# Patient Record
Sex: Female | Born: 1985 | Race: White | Hispanic: No | Marital: Single | Smoking: Never smoker
Health system: Southern US, Community
[De-identification: ages and names within clinical notes are randomized; demographics above are authoritative.]

---

## 2016-06-18 DIAGNOSIS — Z3041 Encounter for surveillance of contraceptive pills: Secondary | ICD-10-CM | POA: Insufficient documentation

## 2019-03-18 ENCOUNTER — Encounter (HOSPITAL_COMMUNITY): Payer: Self-pay | Admitting: Emergency Medicine

## 2019-03-18 ENCOUNTER — Emergency Department (HOSPITAL_COMMUNITY): Payer: Worker's Compensation

## 2019-03-18 ENCOUNTER — Other Ambulatory Visit: Payer: Self-pay

## 2019-03-18 ENCOUNTER — Emergency Department (HOSPITAL_COMMUNITY)
Admission: EM | Admit: 2019-03-18 | Discharge: 2019-03-18 | Disposition: A | Discharge status: Home / Self Care | Payer: Worker's Compensation | Attending: Emergency Medicine | Admitting: Emergency Medicine

## 2019-03-18 DIAGNOSIS — M25512 Pain in left shoulder: Secondary | ICD-10-CM | POA: Diagnosis not present

## 2019-03-18 DIAGNOSIS — S0990XA Unspecified injury of head, initial encounter: Secondary | ICD-10-CM | POA: Insufficient documentation

## 2019-03-18 DIAGNOSIS — Y999 Unspecified external cause status: Secondary | ICD-10-CM | POA: Insufficient documentation

## 2019-03-18 DIAGNOSIS — Y9301 Activity, walking, marching and hiking: Secondary | ICD-10-CM | POA: Diagnosis not present

## 2019-03-18 DIAGNOSIS — Y92219 Unspecified school as the place of occurrence of the external cause: Secondary | ICD-10-CM | POA: Insufficient documentation

## 2019-03-18 DIAGNOSIS — W01198A Fall on same level from slipping, tripping and stumbling with subsequent striking against other object, initial encounter: Secondary | ICD-10-CM | POA: Diagnosis not present

## 2019-03-18 MED ORDER — IBUPROFEN 400 MG PO TABS
600.0000 mg | ORAL_TABLET | Freq: Once | ORAL | Status: AC
  Administered 2019-03-18: 600 mg via ORAL
  Filled 2019-03-18: qty 1

## 2019-03-18 NOTE — ED Notes (Addendum)
Here by EMS, ambulatory from stretcher to wheelchair. Gait steady. Does have 20g to [place].

## 2019-03-18 NOTE — ED Notes (Signed)
Patient verbalizes understanding of discharge instructions. Opportunity for questioning and answers were provided. Armband removed by staff, pt discharged from ED.  

## 2019-03-18 NOTE — Discharge Instructions (Signed)
Please read instructions below. You can treat your headache and shoulder pain with over-the-counter medications such as tylenol as needed. Apply ice for 20 minutes at a time. Stay hydrated and get plenty of rest. Limit your screen time and complex thinking. Avoid any contact sports/activities to prevent re-injury to your head. Follow up with your primary care provider in 1 week for re-check and to be cleared to return to normal activity. Return to the ER if you develop severely worsening headache, changes in your vision, persistent vomiting, or new or concerning symptoms.

## 2019-03-18 NOTE — ED Provider Notes (Signed)
[facility] EMERGENCY DEPARTMENT Provider Note   CSN: [id number] Arrival date & time: 03/18/19  1328     History   Chief Complaint Chief Complaint  Patient presents with  . Fall    HPI Alexis Bautista is a 33 y.o. female presenting the emergency department with complaint of left shoulder pain after mechanical fall that occurred prior to arrival.  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  She states she was trying to catch the door for a colleague when she excellently tripped over the rolled up rug and hit her right frontal scalp on the edge of the door and fell down onto her left shoulder.  She has pain to the left shoulder radiating to left clavicle that is worse with movement.  She also has some localized headache where she hit her head.  No LOC.  No associated vomiting, neck pain, back pain, vision changes, or any other complaints today.  Not on anticoagulation.     The history is provided by the patient.    History reviewed. No pertinent past medical history.  There are no active problems to display for this patient.   History reviewed. No pertinent surgical history.   OB History   No obstetric history on file.      Home Medications    Prior to Admission medications   Not on File    Family History No family history on file.  Social History Social History   Tobacco Use  . Smoking status: Not on file  Substance Use Topics  . Alcohol use: Not on file  . Drug use: Not on file     Allergies   Patient has no allergy information on record.   Review of Systems Review of Systems  All other systems reviewed and are negative.    Physical Exam Updated Vital Signs BP 137/81 (BP Location: Right Arm)   Pulse 66   Temp 98.3 F (36.8 C) (Oral)   Resp 18   LMP 03/17/2019   SpO2 100%   Physical Exam Vitals signs and nursing note reviewed.  Constitutional:      Appearance: She is  well-developed.  HENT:     Head: Normocephalic.     Comments: Very superficial abrasions to the right frontal scalp.  No large hematoma.  No deformity. Eyes:     Conjunctiva/sclera: Conjunctivae normal.  Neck:     Musculoskeletal: Normal range of motion and neck supple.     Comments: No midline C-spine or paraspinal tenderness, no any subsequent deformities.  Neck with full normal range of motion without pain. Cardiovascular:     Rate and Rhythm: Normal rate and regular rhythm.  Pulmonary:     Effort: Pulmonary effort is normal. No respiratory distress.     Breath sounds: Normal breath sounds.  Musculoskeletal:     Comments: Generalized tenderness to left shoulder joint and left clavicle.  No deformity or swelling.  Range of motion is normal though does cause pain.  Neurological:     Mental Status: She is alert.     Comments: Mental Status:  Alert, oriented, thought content appropriate, able to give a coherent history. Speech fluent without evidence of aphasia. Able to follow 2 step commands without difficulty.  Cranial Nerves:  II:  Peripheral visual fields grossly normal, pupils equal, round, reactive to light III,IV, VI: ptosis not present, extra-ocular motions intact bilaterally  V,VII: smile symmetric, facial light  touch sensation equal VIII: hearing grossly normal to voice  X: uvula elevates symmetrically  XI: bilateral shoulder shrug symmetric and strong XII: midline tongue extension without fassiculations Motor:  Normal tone. 5/5 in upper and lower extremities bilaterally including strong and equal grip strength and dorsiflexion/plantar flexion Sensory: grossly normal in all extremities.  Gait: normal gait and balance CV: distal pulses palpable throughout    Psychiatric:        Mood and Affect: Mood normal.        Behavior: Behavior normal.      ED Treatments / Results  Labs (all labs ordered are listed, but only abnormal results are displayed) Labs Reviewed - No  data to display  EKG None  Radiology Dg Clavicle Left  Result Date: 03/18/2019 CLINICAL DATA:  Pain at the left shoulder and clavicle after fall EXAM: LEFT CLAVICLE - 2+ VIEWS; LEFT SHOULDER - 2+ VIEW COMPARISON:  None. FINDINGS: Left shoulder is intact without fracture or dislocation. Glenohumeral and acromioclavicular joints are normal. Dedicated views of the left clavicle show no acute fracture or malalignment. No focal soft tissue abnormality. IMPRESSION: No acute osseous abnormality of the left shoulder or clavicle. Electronically Signed   By: Davina Poke M.D.   On: 03/18/2019 16:47   Dg Shoulder Left  Result Date: 03/18/2019 CLINICAL DATA:  Pain at the left shoulder and clavicle after fall EXAM: LEFT CLAVICLE - 2+ VIEWS; LEFT SHOULDER - 2+ VIEW COMPARISON:  None. FINDINGS: Left shoulder is intact without fracture or dislocation. Glenohumeral and acromioclavicular joints are normal. Dedicated views of the left clavicle show no acute fracture or malalignment. No focal soft tissue abnormality. IMPRESSION: No acute osseous abnormality of the left shoulder or clavicle. Electronically Signed   By: Davina Poke M.D.   On: 03/18/2019 16:47    Procedures Procedures (including critical care time)  Medications Ordered in ED Medications  ibuprofen (ADVIL) tablet 600 mg (600 mg Oral Given 03/18/19 1656)     Initial Impression / Assessment and Plan / ED Course  I have reviewed the triage vital signs and the nursing notes.  Pertinent labs & imaging results that were available during my care of the patient were reviewed by me and considered in my medical decision making (see chart for details).        Patient presenting with left shoulder pain and right scalp contusion after mechanical fall that occurred prior to arrival.  No LOC, now on anticoagulation.  Patient has mild headache though no red flags concerning for his head injury.  Patient has normal neurologic exam and is  well-appearing.  Do not believe the CT imaging of the head is indicated at this time.  Imaging of the left shoulder and clavicle are negative for acute fracture dislocation.  Pain treated in the ED with ibuprofen and ice with some improvement in symptoms.  Recommend symptomatic management and concussion precautions.  Patient is well-appearing, agreeable plan, and safe for discharge.  Discussed results, findings, treatment and follow up. Patient advised of return precautions. Patient verbalized understanding and agreed with plan.   Final Clinical Impressions(s) / ED Diagnoses   Final diagnoses:  Minor head injury, initial encounter  Acute pain of left shoulder    ED Discharge Orders    None       Rayfield Beem, [place] N, PA-C 03/18/19 1728    Quintella Reichert, MD 03/19/19 [phone]

## 2019-03-18 NOTE — ED Triage Notes (Signed)
Pt to ER for evaluation after a fall. She tripped and fell, hitting her head. Reports dizziness after. A/o x4. No anticoagulation.

## 2019-03-18 NOTE — ED Notes (Signed)
To x-ray

## 2019-03-24 ENCOUNTER — Other Ambulatory Visit: Payer: Self-pay

## 2019-03-25 ENCOUNTER — Ambulatory Visit (INDEPENDENT_AMBULATORY_CARE_PROVIDER_SITE_OTHER): Payer: Worker's Compensation | Admitting: Family Medicine

## 2019-03-25 ENCOUNTER — Encounter: Payer: Self-pay | Admitting: Family Medicine

## 2019-03-25 VITALS — BP 118/90 | HR 106 | Temp 97.9°F | Ht 73.0 in | Wt 256.8 lb

## 2019-03-25 DIAGNOSIS — Z7689 Persons encountering health services in other specified circumstances: Secondary | ICD-10-CM | POA: Diagnosis not present

## 2019-03-25 DIAGNOSIS — M549 Dorsalgia, unspecified: Secondary | ICD-10-CM | POA: Diagnosis not present

## 2019-03-25 DIAGNOSIS — W19XXXD Unspecified fall, subsequent encounter: Secondary | ICD-10-CM

## 2019-03-25 DIAGNOSIS — M25512 Pain in left shoulder: Secondary | ICD-10-CM | POA: Diagnosis not present

## 2019-03-25 MED ORDER — CYCLOBENZAPRINE HCL 5 MG PO TABS: 5.0000 mg | ORAL_TABLET | Freq: Every day | ORAL | 0 refills | Status: DC

## 2019-03-25 MED ORDER — METHYLPREDNISOLONE 4 MG PO TBPK: ORAL_TABLET | ORAL | 0 refills | Status: DC

## 2019-03-25 MED ORDER — NAPROXEN 500 MG PO TABS: 500.0000 mg | ORAL_TABLET | Freq: Two times a day (BID) | ORAL | 0 refills | Status: DC

## 2019-03-25 NOTE — Progress Notes (Signed)
Alexis Bautista is a 33 y.o. female  Chief Complaint  Patient presents with  . Follow-up    workers comp injury follow up    HPI: Alexis Bautista is a 33 y.o. female here to establish care with our office. 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 She tripped and fell over an outdoor rug at school and hit Rt front of her head on a door frame and landed on her Lt side. This occurred 1 week ago. She went to ER, xrays of Lt shoulder and clavicle were negative.  She complains of intermittent "soreness" that is mostly with motion. Limited ROM in Lt shoulder.  Lower back pain x 3 days. Pain worse with movement. No headache, dizziness, no vision changes. Pts sleep is at baseline. No new numbness or tingling. No weakness. Pt has been taking ibuprofen 800mg  every 6-8hrs, using heat and ice. She has also been doing "basic yoga stretches".   History reviewed. No pertinent past medical history.  History reviewed. No pertinent surgical history.  Social History   Socioeconomic History  . Marital status: Single    Spouse name: Not on file  . Number of children: Not on file  . Years of education: Not on file  . Highest education level: Not on file  Occupational History  . Not on file  Social Needs  . Financial resource strain: Not on file  . Food insecurity    Worry: Not on file    Inability: Not on file  . Transportation needs    Medical: Not on file    Non-medical: Not on file  Tobacco Use  . Smoking status: Never Smoker  . Smokeless tobacco: Never Used  Substance and Sexual Activity  . Alcohol use: Yes  . Drug use: Never  . Sexual activity: Not on file  Lifestyle  . Physical activity    Days per week: Not on file    Minutes per session: Not on file  . Stress: Not on file  Relationships  . Social Herbalist on phone: Not on file    Gets together: Not on file    Attends religious service: Not on file    Active member of club or organization: Not on file   Attends meetings of clubs or organizations: Not on file    Relationship status: Not on file  . Intimate partner violence    Fear of current or ex partner: Not on file    Emotionally abused: Not on file    Physically abused: Not on file    Forced sexual activity: Not on file  Other Topics Concern  . Not on file  Social History Narrative  . Not on file    Family History  Problem Relation Age of Onset  . Cancer Mother   . Hypertension Father       There is no immunization history on file for this patient.  Outpatient Encounter Medications as of 03/25/2019  Medication Sig  . BLISOVI 24 FE 1-20 MG-MCG(24) tablet Take 1 tablet by mouth daily.  Marland Kitchen lithium carbonate (ESKALITH) 450 MG CR tablet Take 450 mg by mouth 2 (two) times daily.  . sertraline (ZOLOFT) 50 MG tablet Take by mouth.  . traZODone (DESYREL) 50 MG tablet SMARTSIG:1-2 Tablet(s) By Mouth Every Night   No facility-administered encounter medications on file as of 03/25/2019.      ROS: Pertinent positives and negatives noted in HPI. Remainder of ROS non-contributory  Allergies  Allergen Reactions  . Coconut Oil   . Fish Allergy Swelling  . Peanut Oil   . Pineapple   . Shellfish Allergy     BP 118/90   Pulse (!) 106   Temp 97.9 F (36.6 C) (Temporal)   Ht 6\' 1"  (1.854 m)   Wt 256 lb 12.8 oz (116.5 kg)   LMP 03/17/2019   SpO2 96%   BMI 33.88 kg/m   Physical Exam  Constitutional: She is oriented to person, place, and time. She appears well-developed and well-nourished. No distress.  Musculoskeletal:        General: Tenderness present. No edema.     Left shoulder: She exhibits decreased range of motion and tenderness. She exhibits no bony tenderness, no swelling and normal strength.       Arms:  Neurological: She is alert and oriented to person, place, and time. She has normal reflexes. She exhibits normal muscle tone. Coordination normal.  Skin: Skin is warm and dry.  Psychiatric: She has a normal  mood and affect.     A/P:  1. Encounter to establish care with new doctor  2. Musculoskeletal back pain 3. Acute pain of left shoulder 4. Accidental fall, subsequent encounter - cont with heating pad 2-3x/day - exercises reviewed with pt and included in AVS. Pt to do 1-2x/day Rx: - methylPREDNISolone (MEDROL DOSEPAK) 4 MG TBPK tablet; Take as directed  Dispense: 21 tablet; Refill: 0 - cyclobenzaprine (FLEXERIL) 5 MG tablet; Take 1 tablet (5 mg total) by mouth at bedtime.  Dispense: 30 tablet; Refill: 0 - at bedtime x 3-4 nights then qHS PRN - naproxen (NAPROSYN) 500 MG tablet; Take 1 tablet (500 mg total) by mouth 2 (two) times daily with a meal.  Dispense: 60 tablet; Refill: 0 - BID with food x 5-7 days then stop - f/u in 3-4 wks if no/minimal improvement, sooner if symptoms worsen Discussed plan and reviewed medications with patient, including risks, benefits, and potential side effects. Pt expressed understand. All questions answered.

## 2019-03-25 NOTE — Patient Instructions (Addendum)
Heating pad 2-3x/day Exercises - see below - 1-2x/day Take naproxen 500mg  1 tab twice per day with food Take medrol dose pack Take flexeril 5mg  at bedtime   Back Exercises The following exercises strengthen the muscles that help to support the trunk and back. They also help to keep the lower back flexible. Doing these exercises can help to prevent back pain or lessen existing pain.  If you have back pain or discomfort, try doing these exercises 2-3 times each day or as told by your health care provider.  As your pain improves, do them once each day, but increase the number of times that you repeat the steps for each exercise (do more repetitions).  To prevent the recurrence of back pain, continue to do these exercises once each day or as told by your health care provider. Do exercises exactly as told by your health care provider and adjust them as directed. It is normal to feel mild stretching, pulling, tightness, or discomfort as you do these exercises, but you should stop right away if you feel sudden pain or your pain gets worse. Exercises Single knee to chest Repeat these steps 3-5 times for each leg: 1. Lie on your back on a firm bed or the floor with your legs extended. 2. Bring one knee to your chest. Your other leg should stay extended and in contact with the floor. 3. Hold your knee in place by grabbing your knee or thigh with both hands and hold. 4. Pull on your knee until you feel a gentle stretch in your lower back or buttocks. 5. Hold the stretch for 10-30 seconds. 6. Slowly release and straighten your leg. Pelvic tilt Repeat these steps 5-10 times: 1. Lie on your back on a firm bed or the floor with your legs extended. 2. Bend your knees so they are pointing toward the ceiling and your feet are flat on the floor. 3. Tighten your lower abdominal muscles to press your lower back against the floor. This motion will tilt your pelvis so your tailbone points up toward the ceiling  instead of pointing to your feet or the floor. 4. With gentle tension and even breathing, hold this position for 5-10 seconds. Cat-cow Repeat these steps until your lower back becomes more flexible: 1. Get into a hands-and-knees position on a firm surface. Keep your hands under your shoulders, and keep your knees under your hips. You may place padding under your knees for comfort. 2. Let your head hang down toward your chest. Contract your abdominal muscles and point your tailbone toward the floor so your lower back becomes rounded like the back of a cat. 3. Hold this position for 5 seconds. 4. Slowly lift your head, let your abdominal muscles relax and point your tailbone up toward the ceiling so your back forms a sagging arch like the back of a cow. 5. Hold this position for 5 seconds.  Press-ups Repeat these steps 5-10 times: 1. Lie on your abdomen (face-down) on the floor. 2. Place your palms near your head, about shoulder-width apart. 3. Keeping your back as relaxed as possible and keeping your hips on the floor, slowly straighten your arms to raise the top half of your body and lift your shoulders. Do not use your back muscles to raise your upper torso. You may adjust the placement of your hands to make yourself more comfortable. 4. Hold this position for 5 seconds while you keep your back relaxed. 5. Slowly return to lying flat on  the floor.  Bridges Repeat these steps 10 times: 1. Lie on your back on a firm surface. 2. Bend your knees so they are pointing toward the ceiling and your feet are flat on the floor. Your arms should be flat at your sides, next to your body. 3. Tighten your buttocks muscles and lift your buttocks off the floor until your waist is at almost the same height as your knees. You should feel the muscles working in your buttocks and the back of your thighs. If you do not feel these muscles, slide your feet 1-2 inches farther away from your buttocks. 4. Hold this  position for 3-5 seconds. 5. Slowly lower your hips to the starting position, and allow your buttocks muscles to relax completely. If this exercise is too easy, try doing it with your arms crossed over your chest. Abdominal crunches Repeat these steps 5-10 times: 1. Lie on your back on a firm bed or the floor with your legs extended. 2. Bend your knees so they are pointing toward the ceiling and your feet are flat on the floor. 3. Cross your arms over your chest. 4. Tip your chin slightly toward your chest without bending your neck. 5. Tighten your abdominal muscles and slowly raise your trunk (torso) high enough to lift your shoulder blades a tiny bit off the floor. Avoid raising your torso higher than that because it can put too much stress on your low back and does not help to strengthen your abdominal muscles. 6. Slowly return to your starting position. Back lifts Repeat these steps 5-10 times: 1. Lie on your abdomen (face-down) with your arms at your sides, and rest your forehead on the floor. 2. Tighten the muscles in your legs and your buttocks. 3. Slowly lift your chest off the floor while you keep your hips pressed to the floor. Keep the back of your head in line with the curve in your back. Your eyes should be looking at the floor. 4. Hold this position for 3-5 seconds. 5. Slowly return to your starting position. Contact a health care provider if:  Your back pain or discomfort gets much worse when you do an exercise.  Your worsening back pain or discomfort does not lessen within 2 hours after you exercise. If you have any of these problems, stop doing these exercises right away. Do not do them again unless your health care provider says that you can. Get help right away if:  You develop sudden, severe back pain. If this happens, stop doing the exercises right away. Do not do them again unless your health care provider says that you can. This information is not intended to replace  advice given to you by your health care provider. Make sure you discuss any questions you have with your health care provider. Document Released: 06/13/2004 Document Revised: 09/10/2018 Document Reviewed: 02/05/2018 Elsevier Patient Education  [place].  Shoulder Range of Motion Exercises Shoulder range of motion (ROM) exercises are done to keep the shoulder moving freely or to increase movement. They are often recommended for people who have shoulder pain or stiffness or who are recovering from a shoulder surgery. Phase 1 exercises When you are able, do this exercise 1-2 times per day for 30-60 seconds in each direction, or as directed by your health care provider. Pendulum exercise To do this exercise while sitting: 1. Sit in a chair or at the edge of your bed with your feet flat on the floor. 2. Let your  affected arm hang down in front of you over the edge of the bed or chair. 3. Relax your shoulder, arm, and hand. [place] your body so your arm gently swings in small circles. You can also use your unaffected arm to start the motion. 5. Repeat changing the direction of the circles, swinging your arm left and right, and swinging your arm forward and back. To do this exercise while standing: 1. Stand next to a sturdy chair or table, and hold on to it with your hand on your unaffected side. 2. Bend forward at the waist. 3. Bend your knees slightly. 4. Relax your shoulder, arm, and hand. 5. While keeping your shoulder relaxed, use body motion to swing your arm in small circles. 6. Repeat changing the direction of the circles, swinging your arm left and right, and swinging your arm forward and back. 7. Between exercises, stand up tall and take a short break to relax your lower back.  Phase 2 exercises Do these exercises 1-2 times per day or as told by your health care provider. Hold each stretch for 30 seconds, and repeat 3 times. Do the exercises with one or both arms as instructed  by your health care provider. For these exercises, sit at a table with your hand and arm supported by the table. A chair that slides easily or has wheels can be helpful. External rotation 1. Turn your chair so that your affected side is nearest to the table. 2. Place your forearm on the table to your side. Bend your elbow about 90 at the elbow (right angle) and place your hand palm facing down on the table. Your elbow should be about 6 inches away from your side. 3. Keeping your arm on the table, lean your body forward. Abduction 1. Turn your chair so that your affected side is nearest to the table. 2. Place your forearm and hand on the table so that your thumb points toward the ceiling and your arm is straight out to your side. 3. Slide your hand out to the side and away from you, using your unaffected arm to do the work. 4. To increase the stretch, you can slide your chair away from the table. Flexion: forward stretch 1. Sit facing the table. Place your hand and elbow on the table in front of you. 2. Slide your hand forward and away from you, using your unaffected arm to do the work. 3. To increase the stretch, you can slide your chair backward. Phase 3 exercises Do these exercises 1-2 times per day or as told by your health care provider. Hold each stretch for 30 seconds, and repeat 3 times. Do the exercises with one or both arms as instructed by your health care provider. Cross-body stretch: posterior capsule stretch 1. Lift your arm straight out in front of you. 2. Bend your arm 90 at the elbow (right angle) so your forearm moves across your body. 3. Use your other arm to gently pull the elbow across your body, toward your other shoulder. Wall climbs 1. Stand with your affected arm extended out to the side with your hand resting on a door frame. 2. Slide your hand slowly up the door frame. 3. To increase the stretch, step through the door frame. Keep your body upright and do not lean.  Wand exercises You will need a cane, a piece of PVC pipe, or a sturdy wooden dowel for wand exercises. Flexion To do this exercise while standing: 1. Hold the wand  with both of your hands, palms down. 2. Using the other arm to help, lift your arms up and over your head, if able. 3. Push upward with your other arm to gently increase the stretch. To do this exercise while lying down: 1. Lie on your back with your elbows resting on the floor and the wand in both your hands. Your hands will be palm down, or pointing toward your feet. 2. Lift your hands toward the ceiling, using your unaffected arm to help if needed. 3. Bring your arms overhead as able, using your unaffected arm to help if needed. Internal rotation 1. Stand while holding the wand behind you with both hands. Your unaffected arm should be extended above your head with the arm of the affected side extended behind you at the level of your waist. The wand should be pointing straight up and down as you hold it. 2. Slowly pull the wand up behind your back by straightening the elbow of your unaffected arm and bending the elbow of your affected arm. External rotation 1. Lie on your back with your affected upper arm supported on a small pillow or rolled towel. When you first do this exercise, keep your upper arm close to your body. Over time, bring your arm up to a 90 angle out to the side. 2. Hold the wand across your stomach and with both hands palm up. Your elbow on your affected side should be bent at a 90 angle. 3. Use your unaffected side to help push your forearm away from you and toward the floor. Keep your elbow on your affected side bent at a 90 angle. Contact a health care provider if you have:  New or increasing pain.  New numbness, tingling, weakness, or discoloration in your arm or hand. This information is not intended to replace advice given to you by your health care provider. Make sure you discuss any questions you have  with your health care provider. Document Released: 02/02/2003 Document Revised: 06/18/2017 Document Reviewed: 06/18/2017 Elsevier Patient Education  2020 [employer].

## 2019-05-11 ENCOUNTER — Other Ambulatory Visit: Payer: Self-pay | Admitting: Family Medicine

## 2019-05-11 DIAGNOSIS — M25512 Pain in left shoulder: Secondary | ICD-10-CM

## 2019-05-11 DIAGNOSIS — W19XXXD Unspecified fall, subsequent encounter: Secondary | ICD-10-CM

## 2019-05-11 DIAGNOSIS — M549 Dorsalgia, unspecified: Secondary | ICD-10-CM

## 2019-05-11 NOTE — Telephone Encounter (Signed)
LVM for the pt to call back.

## 2019-05-12 NOTE — Telephone Encounter (Signed)
LVM for the pt to call back.

## 2019-05-17 NOTE — Telephone Encounter (Signed)
Dr. Loletha Grayer please advise, LVM for the pt to call but pt have not call our office back yet. Last refill/ov was 03/25/2019.

## 2019-06-21 ENCOUNTER — Other Ambulatory Visit: Payer: Self-pay

## 2019-06-22 NOTE — Patient Instructions (Signed)
Health Maintenance Due  Topic Date Due  . HIV Screening  01/29/2002  . TETANUS/TDAP  01/29/2006  . PAP SMEAR-Modifier  01/30/2008    Depression screen PHQ 2/9 03/25/2019  Decreased Interest 0  Down, Depressed, Hopeless 1  PHQ - 2 Score 1

## 2019-06-23 ENCOUNTER — Encounter: Payer: Self-pay | Admitting: Family Medicine

## 2019-06-23 ENCOUNTER — Other Ambulatory Visit: Payer: Self-pay

## 2019-06-23 ENCOUNTER — Ambulatory Visit: Payer: BC Managed Care – PPO | Admitting: Family Medicine

## 2019-06-23 VITALS — BP 120/82 | HR 89 | Temp 98.2°F | Ht 73.0 in | Wt 256.2 lb

## 2019-06-23 DIAGNOSIS — M25561 Pain in right knee: Secondary | ICD-10-CM | POA: Diagnosis not present

## 2019-06-23 DIAGNOSIS — M25461 Effusion, right knee: Secondary | ICD-10-CM

## 2019-06-23 DIAGNOSIS — M5442 Lumbago with sciatica, left side: Secondary | ICD-10-CM | POA: Diagnosis not present

## 2019-06-23 DIAGNOSIS — M5441 Lumbago with sciatica, right side: Secondary | ICD-10-CM | POA: Diagnosis not present

## 2019-06-23 DIAGNOSIS — G8929 Other chronic pain: Secondary | ICD-10-CM

## 2019-06-23 NOTE — Progress Notes (Signed)
Alexis Bautista is a 34 y.o. female  Chief Complaint  Patient presents with  . Knee Pain    Pt c/o rt knee and back pain.  Knee starting Dec Pt has a hx of knee and back pain. Swelling and pain,  pt taking Ibuprofen for the pain and also wearing a knee brace.    HPI: Alexis Bautista is a 34 y.o. female who complains of Rt knee since 04/2019. No injury or trauma. Motion from sitting to standing and vice versa, walking, sitting for long period all hurt.  Pt notes some level of back pain daily, but worse in the past 3 weeks.  She is taking ibuprofen 600-800mg  2x/day. She tried TENS unit, stretching, yoga, saw chiropractor all without significant relief. Pain is located in lower back. Pain occasionally radiates to leg and she will also get numbness and tingling in LE at times. No LE weakness.  Pt with Rt knee pain that is worse than her intermittent baseline knee pain. Knee is swollen at end of the day. She notes audible crunch when going up 30 stairs. She states knee buckles or "like it's collapsing". She has not fallen and has been able to catch herself the times this has happened. She purchased OTC knee brace which has helped with swelling. She has worn it daily x 1 mo.  Pt notes chronic knee and back pain (intermittent) since high school. She played volleyball in high school. She would like to see ortho  History reviewed. No pertinent past medical history.  History reviewed. No pertinent surgical history.  Social History   Socioeconomic History  . Marital status: Single    Spouse name: Not on file  . Number of children: Not on file  . Years of education: Not on file  . Highest education level: Not on file  Occupational History  . Not on file  Tobacco Use  . Smoking status: Never Smoker  . Smokeless tobacco: Never Used  Substance and Sexual Activity  . Alcohol use: Yes  . Drug use: Never  . Sexual activity: Not on file  Other Topics Concern  . Not on file  Social History Narrative    . Not on file   Social Determinants of Health   Financial Resource Strain:   . Difficulty of Paying Living Expenses: Not on file  Food Insecurity:   . Worried About Charity fundraiser in the Last Year: Not on file  . Ran Out of Food in the Last Year: Not on file  Transportation Needs:   . Lack of Transportation (Medical): Not on file  . Lack of Transportation (Non-Medical): Not on file  Physical Activity:   . Days of Exercise per Week: Not on file  . Minutes of Exercise per Session: Not on file  Stress:   . Feeling of Stress : Not on file  Social Connections:   . Frequency of Communication with Friends and Family: Not on file  . Frequency of Social Gatherings with Friends and Family: Not on file  . Attends Religious Services: Not on file  . Active Member of Clubs or Organizations: Not on file  . Attends Archivist Meetings: Not on file  . Marital Status: Not on file  Intimate Partner Violence:   . Fear of Current or Ex-Partner: Not on file  . Emotionally Abused: Not on file  . Physically Abused: Not on file  . Sexually Abused: Not on file    Family History  Problem Relation Age  of Onset  . Cancer Mother   . Hypertension Father       There is no immunization history on file for this patient.  Outpatient Encounter Medications as of 06/23/2019  Medication Sig  . BLISOVI 24 FE 1-20 MG-MCG(24) tablet Take 1 tablet by mouth daily.  Marland Kitchen lithium carbonate (ESKALITH) 450 MG CR tablet Take 450 mg by mouth 2 (two) times daily.  . sertraline (ZOLOFT) 50 MG tablet Take by mouth.  . traZODone (DESYREL) 50 MG tablet SMARTSIG:1-2 Tablet(s) By Mouth Every Night  . cyclobenzaprine (FLEXERIL) 5 MG tablet Take 1 tablet (5 mg total) by mouth at bedtime. (Patient not taking: Reported on 06/23/2019)  . methylPREDNISolone (MEDROL DOSEPAK) 4 MG TBPK tablet Take as directed (Patient not taking: Reported on 06/23/2019)  . naproxen (NAPROSYN) 500 MG tablet TAKE 1 TABLET (500 MG TOTAL) BY  MOUTH 2 (TWO) TIMES DAILY WITH A MEAL. (Patient not taking: Reported on 06/23/2019)   No facility-administered encounter medications on file as of 06/23/2019.     ROS: Pertinent positives and negatives noted in HPI. Remainder of ROS non-contributory   Allergies  Allergen Reactions  . Coconut Oil   . Fish Allergy Swelling  . Peanut Oil   . Pineapple   . Shellfish Allergy     Pulse 89   Temp 98.2 F (36.8 C) (Temporal)   Ht 6\' 1"  (1.854 m)   Wt 256 lb 3.2 oz (116.2 kg)   LMP 06/13/2019   SpO2 98%   BMI 33.80 kg/m   Physical Exam  Constitutional: She is oriented to person, place, and time. She appears well-developed and well-nourished. No distress.  Musculoskeletal:     Lumbar back: Tenderness present. Decreased range of motion.     Right knee: Swelling present. No effusion. Decreased range of motion. Tenderness present.  Neurological: She is alert and oriented to person, place, and time.  Psychiatric: She has a normal mood and affect. Her behavior is normal.     A/P:  1. Chronic bilateral low back pain with bilateral sciatica 2. Chronic pain of right knee 3. Pain and swelling of right knee - pt with multiple, acute on chronic ortho issues. No/minimal improvement with ibuprofen 600-800mg  BID, TENS unit, chiropractic care, yoga, stretching exercises - Ambulatory referral to Orthopedic Surgery   This visit occurred during the SARS-CoV-2 public health emergency.  Safety protocols were in place, including screening questions prior to the visit, additional usage of staff PPE, and extensive cleaning of exam room while observing appropriate contact time as indicated for disinfecting solutions.

## 2019-06-30 ENCOUNTER — Ambulatory Visit: Payer: Self-pay

## 2019-06-30 ENCOUNTER — Other Ambulatory Visit: Payer: Self-pay

## 2019-06-30 ENCOUNTER — Encounter: Payer: Self-pay | Admitting: Orthopaedic Surgery

## 2019-06-30 ENCOUNTER — Ambulatory Visit: Payer: BC Managed Care – PPO | Admitting: Orthopaedic Surgery

## 2019-06-30 DIAGNOSIS — M545 Low back pain, unspecified: Secondary | ICD-10-CM

## 2019-06-30 DIAGNOSIS — M25561 Pain in right knee: Secondary | ICD-10-CM

## 2019-06-30 MED ORDER — LIDOCAINE HCL 1 % IJ SOLN
3.0000 mL | INTRAMUSCULAR | Status: AC | PRN
  Administered 2019-06-30: 11:00:00 3 mL

## 2019-06-30 MED ORDER — METHYLPREDNISOLONE ACETATE 40 MG/ML IJ SUSP
40.0000 mg | INTRAMUSCULAR | Status: AC | PRN
  Administered 2019-06-30: 40 mg via INTRA_ARTICULAR

## 2019-06-30 NOTE — Progress Notes (Signed)
Office Visit Note   Patient: Alexis Bautista           Date of Birth: March 03, 1986           MRN: [id number] Visit Date: 06/30/2019              Requested by: Ronnald Nian, DO [place],   [id number] PCP: Ronnald Nian, DO   Assessment & Plan: Visit Diagnoses:  1. Low back pain, unspecified back pain laterality, unspecified chronicity, unspecified whether sciatica present   2. Right knee pain, unspecified chronicity     Plan: She will work on quad strengthening right knee.  Exercises were shown.  Continue her ibuprofen or naproxen but not both.  She will also continue her cyclobenzaprine for back.  She will call if she needs a refill on Flexeril.  Due to the chronic nature of her low back pain which is becoming worse and waking her despite conservative measures recommend MRI rule out HNP as a source of her radicular pain down the right leg.  Have her follow-up after the MRI to go over results discuss further treatment.  Questions were encouraged and answered by Dr. Ninfa Linden and myself  Follow-Up Instructions: No follow-ups on file.   Orders:  Orders Placed This Encounter  Procedures  . Large Joint Inj  . XR Lumbar Spine 2-3 Views  . XR Knee 1-2 Views Right   No orders of the defined types were placed in this encounter.     Procedures: Large Joint Inj: R knee on 06/30/2019 10:42 AM Indications: pain Details: 22 G 1.5 in needle, anterolateral approach  Arthrogram: No  Medications: 3 mL lidocaine 1 %; 40 mg methylPREDNISolone acetate 40 MG/ML Outcome: tolerated well, no immediate complications Procedure, treatment alternatives, risks and benefits explained, specific risks discussed. Consent was given by the patient. Immediately prior to procedure a time out was called to verify the correct patient, procedure, equipment, support staff and site/side marked as required. Patient was prepped and draped in the usual sterile fashion.        Clinical Data: No additional findings.   Subjective: Chief Complaint  Patient presents with  . Lower Back - Pain  . Right Knee - Pain    HPI Alexis Bautista is a 34 year old female comes in today with low back pain that is been ongoing for years.  Progressively worse over the last few months.  She states that she has numbness tingling and pain down to her mid right calf from the back.  Occasionally goes down to her foot.  Does not awaken her.  She denies any bowel bladder dysfunction or saddle anesthesia like symptoms.  She has tried stretching TENS unit yoga ice.  She also was recently on a Dosepak of her shoulder which gave her no relief of back pain.  Patient takes naproxen or ibuprofen is helped some with her back.  No known injury lumbar spine. Right knee pain for the last 2 months no known injury.  She is wearing a knee brace.  Feels that the knee buckles on her and catches.  She also has painful popping.  She notes a crunching sound that initially started with going up and down steps but now occurs with time.  She also notes swelling of the right knee.  She has taken ibuprofen and naproxen which helps some with the knee pain. Review of Systems Negative for fevers, chills shortness of breath chest pain.  Please see HPI otherwise negative or  noncontributory.  Objective: Vital Signs: LMP 06/13/2019   Physical Exam Constitutional:      Appearance: She is not ill-appearing or diaphoretic.  Pulmonary:     Effort: Pulmonary effort is normal.  Neurological:     Mental Status: She is alert and oriented to person, place, and time.  Psychiatric:        Mood and Affect: Mood normal.     Ortho Exam Lumbar spine tenderness over the lower lumbar spinal column and the lower lumbar right paraspinous region.  She has limited flexion extension.  She has difficulty lying down flat due to her back pain.  Negative straight leg raise bilaterally.  5 out of 5 strength throughout lower extremities  against resistance.  Dorsal pedal pulses were 2+ bilaterally and equal symmetric.  Sensation grossly intact bilateral feet to light touch. Bilateral knees no abnormal warmth erythema or effusion.  No instability valgus varus stressing.  Anterior drawer is negative bilaterally.  Good range of motion of both knees.  Tenderness along the medial joint line of the right knee but to a greater degree over the lateral joint lines both knees.  McMurray's is negative bilaterally. Specialty Comments:  No specialty comments available.  Imaging: XR Knee 1-2 Views Right  Result Date: 06/30/2019 AP lateral views right knee: No acute fractures.  Mild narrowing medial joint line.  Knee is otherwise without subluxation or dislocation.  XR Lumbar Spine 2-3 Views  Result Date: 06/30/2019 Lumbar spine AP lateral views: Loss of lordotic curvature.  Slight disc space narrowing at L4-5.  No spondylolisthesis.  No acute fractures. Osseous acute findings otherwise.    PMFS History: There are no problems to display for this patient.  History reviewed. No pertinent past medical history.  Family History  Problem Relation Age of Onset  . Cancer Mother   . Hypertension Father     History reviewed. No pertinent surgical history. Social History   Occupational History  . Not on file  Tobacco Use  . Smoking status: Never Smoker  . Smokeless tobacco: Never Used  Substance and Sexual Activity  . Alcohol use: Yes  . Drug use: Never  . Sexual activity: Not on file

## 2019-06-30 NOTE — Addendum Note (Signed)
Addended by: Michae Kava B on: 06/30/2019 11:03 AM   Modules accepted: Orders

## 2019-07-03 ENCOUNTER — Ambulatory Visit (HOSPITAL_BASED_OUTPATIENT_CLINIC_OR_DEPARTMENT_OTHER)
Admission: RE | Admit: 2019-07-03 | Discharge: 2019-07-03 | Disposition: A | Discharge status: Home / Self Care | Payer: BC Managed Care – PPO | Source: Ambulatory Visit | Attending: Orthopaedic Surgery | Admitting: Orthopaedic Surgery

## 2019-07-03 ENCOUNTER — Other Ambulatory Visit: Payer: Self-pay

## 2019-07-03 ENCOUNTER — Encounter (HOSPITAL_BASED_OUTPATIENT_CLINIC_OR_DEPARTMENT_OTHER): Payer: Self-pay

## 2019-07-03 DIAGNOSIS — M545 Low back pain, unspecified: Secondary | ICD-10-CM

## 2019-07-07 ENCOUNTER — Ambulatory Visit: Payer: BC Managed Care – PPO | Admitting: Orthopaedic Surgery

## 2019-07-10 ENCOUNTER — Ambulatory Visit (HOSPITAL_BASED_OUTPATIENT_CLINIC_OR_DEPARTMENT_OTHER)
Admission: RE | Admit: 2019-07-10 | Discharge: 2019-07-10 | Disposition: A | Discharge status: Home / Self Care | Payer: BC Managed Care – PPO | Source: Ambulatory Visit | Attending: Orthopaedic Surgery | Admitting: Orthopaedic Surgery

## 2019-07-10 ENCOUNTER — Other Ambulatory Visit: Payer: Self-pay

## 2019-07-10 DIAGNOSIS — M545 Low back pain: Secondary | ICD-10-CM | POA: Diagnosis not present

## 2019-07-14 ENCOUNTER — Other Ambulatory Visit: Payer: Self-pay

## 2019-07-14 ENCOUNTER — Encounter: Payer: Self-pay | Admitting: Orthopaedic Surgery

## 2019-07-14 ENCOUNTER — Ambulatory Visit: Payer: BC Managed Care – PPO | Admitting: Orthopaedic Surgery

## 2019-07-14 DIAGNOSIS — M545 Low back pain, unspecified: Secondary | ICD-10-CM

## 2019-07-14 DIAGNOSIS — G8929 Other chronic pain: Secondary | ICD-10-CM

## 2019-07-14 NOTE — Progress Notes (Signed)
The patient comes in today to go over an MRI of her lumbar spine.  She has been having a long course of low back pain in the midline of the low back but it does radiate down her right hip..  She has been also dealing with some right knee issues.  She has had a steroid injection in her right knee and does use a hinged knee brace.  It seems like the back is been more of an issue for her so we did send her for an MRI of the lumbar spine.  She still has the same complaints of right-sided sciatica and midline to right-sided low back pain.  On examination today there is just slightly positive straight leg raise on the right side.  She does have pain with flexion extension of the lumbar spine and she seems to have a deep sciatic type of pain on the right side.  MRIs reviewed with her.  There are disc bulges at L3-L4 and L4-L5.  At L3-4 seems to be more of a foraminal type of protrusion that is likely irritating the nerve in that area.  The L4-L5 disc is more of a central disc and it may be affecting both the left and right nerves.  At this point it is worth a 2 pronged approach with send her to outpatient physical therapy for her lumbar spine for traction and other modalities that can decrease her symptoms.  I would also like to send her to Dr. Ernestina Patches for a right-sided epidural steroid injection at L3-L4 versus L4-L5.  She agrees with this treatment plan.  All questions and concerns were answered and addressed.  I will see her back myself in 4 weeks to see how she is doing overall.

## 2019-07-27 ENCOUNTER — Ambulatory Visit: Payer: BC Managed Care – PPO | Admitting: Physical Therapy

## 2019-07-27 ENCOUNTER — Telehealth: Payer: Self-pay | Admitting: Orthopaedic Surgery

## 2019-07-27 NOTE — Telephone Encounter (Signed)
Patient called. She would like a referral for PT sent outside of [facility]. Her co pay will be cheaper vs $72 here for PT. Her call back number is [phone]

## 2019-07-27 NOTE — Telephone Encounter (Signed)
Can we change the facility to somewhere other than [facility]?

## 2019-07-28 ENCOUNTER — Telehealth: Payer: Self-pay | Admitting: Orthopaedic Surgery

## 2019-07-28 NOTE — Telephone Encounter (Signed)
LMOM in regards to patient. Patient confirmed only [employer].

## 2019-07-28 NOTE — Telephone Encounter (Signed)
Faxed to [facility]  

## 2019-07-28 NOTE — Telephone Encounter (Signed)
Spoke with patient. Advised location [facility] at [facility] location. Confirmed insurance is only Nurse, mental health not workers comp.

## 2019-07-28 NOTE — Telephone Encounter (Signed)
[facility] called and stated that a referral was sent and they want to know if this is a WC or [employer] it has both.  [phone]

## 2019-07-29 ENCOUNTER — Ambulatory Visit: Payer: BC Managed Care – PPO | Admitting: Family Medicine

## 2019-08-04 ENCOUNTER — Telehealth: Payer: Self-pay | Admitting: Family Medicine

## 2019-08-04 NOTE — Telephone Encounter (Signed)
Thank you. I was able to schedule an appt for her on 3/31.

## 2019-08-04 NOTE — Telephone Encounter (Signed)
Yes, as long as pt is stable on lithium I'm happy to see her and refill all meds

## 2019-08-04 NOTE — Telephone Encounter (Signed)
Before she schedules a followup appt, patient wants to know if Dr. Bryan Lemma will be able to refill her psychiatric meds, lithium, Trazodone and sertraline.

## 2019-08-11 ENCOUNTER — Other Ambulatory Visit: Payer: Self-pay

## 2019-08-11 ENCOUNTER — Ambulatory Visit: Payer: BC Managed Care – PPO | Admitting: Orthopaedic Surgery

## 2019-08-11 ENCOUNTER — Encounter: Payer: Self-pay | Admitting: Orthopaedic Surgery

## 2019-08-11 DIAGNOSIS — M545 Low back pain, unspecified: Secondary | ICD-10-CM

## 2019-08-11 DIAGNOSIS — G8929 Other chronic pain: Secondary | ICD-10-CM

## 2019-08-11 NOTE — Progress Notes (Signed)
The patient is a 34 year old female worsening in follow-up as a relates to chronic low back pain.  At her last visit we had gone over the MRI of her lumbar spine showing multilevel degenerative disc issues.  There is also questionable nerve impingement bilaterally at L4-L5 and L3-L4.  We recommended 2 pronged approach with physical therapy as well as epidural steroid injections.  She decided eventually she wanted just to see how therapy would do for her and she said it was working some and she still going to therapy at [facility] at this point now feels that it is appropriate to try injections in her back.  She said the physical therapy and home exercise program are helping some and she is usually better first thing in the morning but as the day progresses on she continues to have low back pain.  Right now she is denying any significant radicular issues.  On exam there is some tightness of her hamstrings bilaterally.  She does not have a true positive straight leg raise bilaterally and she has normal strength and sensation in the legs.  At this point I would like to have her continue physical therapy we will also send her to Dr. Ernestina Patches for injections in her lumbar spine which potentially her bilateral L4-L5 versus L3-L4.  I will have him review this as well to see if he feels this more appropriate for ESI's versus facet injections.  All question concerns were answered addressed.  I will see her back myself in about 6 weeks.

## 2019-08-12 ENCOUNTER — Telehealth: Payer: Self-pay | Admitting: Orthopaedic Surgery

## 2019-08-12 NOTE — Telephone Encounter (Signed)
Patient aware restrictions just for the day of injection

## 2019-08-12 NOTE — Telephone Encounter (Signed)
Pt called in stating she's getting a steroid shot and wanted to know how long her driving abilities would be affected for?   [phone]

## 2019-08-18 ENCOUNTER — Encounter: Payer: Self-pay | Admitting: Family Medicine

## 2019-08-18 ENCOUNTER — Ambulatory Visit: Payer: BC Managed Care – PPO | Admitting: Family Medicine

## 2019-08-18 ENCOUNTER — Other Ambulatory Visit: Payer: Self-pay

## 2019-08-18 VITALS — BP 110/82 | HR 97 | Temp 98.1°F | Ht 73.0 in | Wt 251.8 lb

## 2019-08-18 DIAGNOSIS — F3160 Bipolar disorder, current episode mixed, unspecified: Secondary | ICD-10-CM | POA: Diagnosis not present

## 2019-08-18 DIAGNOSIS — F419 Anxiety disorder, unspecified: Secondary | ICD-10-CM

## 2019-08-18 MED ORDER — SERTRALINE HCL 50 MG PO TABS: 75.0000 mg | ORAL_TABLET | Freq: Every day | ORAL | 3 refills | Status: DC

## 2019-08-18 MED ORDER — TRAZODONE HCL 50 MG PO TABS: 50.0000 mg | ORAL_TABLET | Freq: Every day | ORAL | 3 refills | Status: DC

## 2019-08-18 MED ORDER — LITHIUM CARBONATE ER 300 MG PO TBCR: 600.0000 mg | EXTENDED_RELEASE_TABLET | Freq: Two times a day (BID) | ORAL | 1 refills | Status: DC

## 2019-08-18 NOTE — Progress Notes (Signed)
Alexis Bautista is a 34 y.o. female  Chief Complaint  Patient presents with  . medication discussion    HPI: Alexis Bautista is a 34 y.o. female here for medication refills. She has a h/o anxiety, depression, bipolar. She has been on lithium since 12/2014 with good control of symptoms. She is currently on 450mg  BID but both she and her father, who is also on lithium, have noticed pt is experiencing more mood extremes in the past few months. She wonders about increasing dose especially since it has nt been changed in years. Last lithium level was about 1 year ago. She also takes trazodone 50mg  qhs, rarely she takes 2 tabs. She is also on zoloft 75mg   She sees a therapist once per month.  Pt previously asked and I agreed to Rx the above meds since she has been overall stable for some time.   History reviewed. No pertinent past medical history.  History reviewed. No pertinent surgical history.  Social History   Socioeconomic History  . Marital status: Single    Spouse name: Not on file  . Number of children: Not on file  . Years of education: Not on file  . Highest education level: Not on file  Occupational History  . Not on file  Tobacco Use  . Smoking status: Never Smoker  . Smokeless tobacco: Never Used  Substance and Sexual Activity  . Alcohol use: Yes  . Drug use: Never  . Sexual activity: Not on file  Other Topics Concern  . Not on file  Social History Narrative  . Not on file   Social Determinants of Health   Financial Resource Strain:   . Difficulty of Paying Living Expenses:   Food Insecurity:   . Worried About Charity fundraiser in the Last Year:   . Arboriculturist in the Last Year:   Transportation Needs:   . Film/video editor (Medical):   Marland Kitchen Lack of Transportation (Non-Medical):   Physical Activity:   . Days of Exercise per Week:   . Minutes of Exercise per Session:   Stress:   . Feeling of Stress :   Social Connections:   . Frequency of Communication  with Friends and Family:   . Frequency of Social Gatherings with Friends and Family:   . Attends Religious Services:   . Active Member of Clubs or Organizations:   . Attends Archivist Meetings:   Marland Kitchen Marital Status:   Intimate Partner Violence:   . Fear of Current or Ex-Partner:   . Emotionally Abused:   Marland Kitchen Physically Abused:   . Sexually Abused:     Family History  Problem Relation Age of Onset  . Cancer Mother   . Hypertension Father       There is no immunization history on file for this patient.  Outpatient Encounter Medications as of 08/18/2019  Medication Sig  . BLISOVI 24 FE 1-20 MG-MCG(24) tablet Take 1 tablet by mouth daily.  Marland Kitchen lithium carbonate (LITHOBID) 300 MG CR tablet Take 2 tablets (600 mg total) by mouth 2 (two) times daily.  . sertraline (ZOLOFT) 50 MG tablet Take 1.5 tablets (75 mg total) by mouth daily.  . traZODone (DESYREL) 50 MG tablet Take 1-2 tablets (50-100 mg total) by mouth at bedtime.  . [DISCONTINUED] lithium carbonate (ESKALITH) 450 MG CR tablet Take 450 mg by mouth 2 (two) times daily.  . [DISCONTINUED] sertraline (ZOLOFT) 50 MG tablet Take by mouth.  . [DISCONTINUED] traZODone (  DESYREL) 50 MG tablet SMARTSIG:1-2 Tablet(s) By Mouth Every Night  . naproxen (NAPROSYN) 500 MG tablet TAKE 1 TABLET (500 MG TOTAL) BY MOUTH 2 (TWO) TIMES DAILY WITH A MEAL. (Patient not taking: Reported on 08/18/2019)  . [DISCONTINUED] cyclobenzaprine (FLEXERIL) 5 MG tablet Take 1 tablet (5 mg total) by mouth at bedtime.  . [DISCONTINUED] methylPREDNISolone (MEDROL DOSEPAK) 4 MG TBPK tablet Take as directed (Patient not taking: Reported on 08/18/2019)   No facility-administered encounter medications on file as of 08/18/2019.     ROS: Pertinent positives and negatives noted in HPI. Remainder of ROS non-contributory    Allergies  Allergen Reactions  . Coconut Oil   . Fish Allergy Swelling  . Peanut Oil   . Pineapple   . Shellfish Allergy     BP 110/82 (BP  Location: Left Arm, Patient Position: Sitting, Cuff Size: Normal)   Pulse 97   Temp 98.1 F (36.7 C) (Temporal)   Ht 6\' 1"  (1.854 m)   Wt 251 lb 12.8 oz (114.2 kg)   LMP 08/10/2019   SpO2 99%   BMI 33.22 kg/m   Physical Exam  Constitutional: She is oriented to person, place, and time. She appears well-developed and well-nourished. No distress.  Pulmonary/Chest: No respiratory distress.  Neurological: She is alert and oriented to person, place, and time. Coordination normal.  Psychiatric: She has a normal mood and affect. Her behavior is normal. Thought content normal.     A/P:  1. Anxiety - stable, chronic Refill: - sertraline (ZOLOFT) 50 MG tablet; Take 1.5 tablets (75 mg total) by mouth daily.  Dispense: 135 tablet; Refill: 3 - traZODone (DESYREL) 50 MG tablet; Take 1-2 tablets (50-100 mg total) by mouth at bedtime.  Dispense: 90 tablet; Refill: 3  2. Bipolar affective, mixed ([place]) - on lithium since 2016, overall stable but more mood swings in the past few months on 450mg  BID - Lithium level Increase: - lithium carbonate (LITHOBID) 300 MG CR tablet; Take 2 tablets (600 mg total) by mouth 2 (two) times daily.  Dispense: 360 tablet; Refill: 1 - pt will take 2 tabs qAM and 1 tab qPM for a few weeks and then increase to 2 tabs BID if needed after that time - plan for repeat lithium level in 3-54mo   This visit occurred during the SARS-CoV-2 public health emergency.  Safety protocols were in place, including screening questions prior to the visit, additional usage of staff PPE, and extensive cleaning of exam room while observing appropriate contact time as indicated for disinfecting solutions.

## 2019-08-19 LAB — LITHIUM LEVEL: Lithium Lvl: 0.6 mmol/L (ref 0.6–1.2)

## 2019-09-02 ENCOUNTER — Ambulatory Visit (INDEPENDENT_AMBULATORY_CARE_PROVIDER_SITE_OTHER): Payer: BC Managed Care – PPO | Admitting: Physical Medicine and Rehabilitation

## 2019-09-02 ENCOUNTER — Ambulatory Visit: Payer: Self-pay

## 2019-09-02 ENCOUNTER — Other Ambulatory Visit: Payer: Self-pay

## 2019-09-02 ENCOUNTER — Encounter: Payer: Self-pay | Admitting: Physical Medicine and Rehabilitation

## 2019-09-02 VITALS — BP 146/96 | HR 74

## 2019-09-02 DIAGNOSIS — M5416 Radiculopathy, lumbar region: Secondary | ICD-10-CM | POA: Diagnosis not present

## 2019-09-02 MED ORDER — METHYLPREDNISOLONE ACETATE 80 MG/ML IJ SUSP
40.0000 mg | Freq: Once | INTRAMUSCULAR | Status: AC
  Administered 2019-09-02: 40 mg

## 2019-09-02 NOTE — Progress Notes (Signed)
.  Numeric Pain Rating Scale and Functional Assessment Average Pain 7   In the last MONTH (on 0-10 scale) has pain interfered with the following?  1. General activity like being  able to carry out your everyday physical activities such as walking, climbing stairs, carrying groceries, or moving a chair?  Rating(7)   +Driver, -BT, -Dye Allergies.   

## 2019-09-03 NOTE — Progress Notes (Signed)
Alexis Bautista - 34 y.o. female MRN [id number]  Date of birth: 11/22/1985  Office Visit Note: Visit Date: 09/02/2019 PCP: Ronnald Nian, DO Referred by: Ronnald Nian, DO  Subjective: Chief Complaint  Patient presents with  . Lower Back - Pain   HPI:  Alexis Bautista is a 34 y.o. female who comes in today At the request of Dr. Jean Rosenthal for lumbar epidural injection.  While only 33 the patient has a chronic history of back pain for many years.  She has a history of back pain from playing volleyball when she was younger.  She was having significant back and leg pain and she has gotten some relief of her leg pain with physical therapy and using ice.  With failure of conservative care MRI was obtained and this is reviewed below.  This shows disc issues mainly at L3-4 and L4-5 without high-grade nerve compression.  Minimal arthritic change.  Still good disc height.  Based on symptoms we will try left L5-S1 interlaminar injection to get flow of medication through the L4-5 level.  This should give her relief bilaterally she has bilateral lower back pain.  Consideration would be given to transforaminal approach bilaterally versus facet block.  She should continue with physical therapy and weight loss.  She is somewhat tearful today because she has had a fall or 2 where she thinks her back is is going to give out on her.  There is no real medical history listed in the chart but the patient is taking medications consistent with underlying depression/ anxiety.  ROS Otherwise per HPI.  Assessment & Plan: Visit Diagnoses:  1. Lumbar radiculopathy     Plan: No additional findings.   Meds & Orders:  Meds ordered this encounter  Medications  . methylPREDNISolone acetate (DEPO-MEDROL) injection 40 mg    Orders Placed This Encounter  Procedures  . XR C-ARM NO REPORT  . Epidural Steroid injection    Follow-up: Return if symptoms worsen or fail to improve.   Procedures: No procedures  performed  Lumbar Epidural Steroid Injection - Interlaminar Approach with Fluoroscopic Guidance  Patient: Alexis Bautista      Date of Birth: March 25, 1986 MRN: [id number] PCP: Ronnald Nian, DO      Visit Date: 09/02/2019   Universal Protocol:     Consent Given By: the patient  Position: PRONE  Additional Comments: Vital signs were monitored before and after the procedure. Patient was prepped and draped in the usual sterile fashion. The correct patient, procedure, and site was verified.   Injection Procedure Details:  Procedure Site One Meds Administered:  Meds ordered this encounter  Medications  . methylPREDNISolone acetate (DEPO-MEDROL) injection 40 mg     Laterality: Left  Location/Site:  L5-S1  Needle size: 20 G  Needle type: Tuohy  Needle Placement: Paramedian epidural  Findings:   -Comments: Excellent flow of contrast into the epidural space.  Procedure Details: Using a paramedian approach from the side mentioned above, the region overlying the inferior lamina was localized under fluoroscopic visualization and the soft tissues overlying this structure were infiltrated with 4 ml. of 1% Lidocaine without Epinephrine. The Tuohy needle was inserted into the epidural space using a paramedian approach.   The epidural space was localized using loss of resistance along with lateral and bi-planar fluoroscopic views.  After negative aspirate for air, blood, and CSF, a 2 ml. volume of Isovue-250 was injected into the epidural space and the flow of contrast was observed. Radiographs were  obtained for documentation purposes.    The injectate was administered into the level noted above.   Additional Comments:  The patient tolerated the procedure well Dressing: 2 x 2 sterile gauze and Band-Aid    Post-procedure details: Patient was observed during the procedure. Post-procedure instructions were reviewed.  Patient left the clinic in stable condition.    Clinical  History: MRI LUMBAR SPINE WITHOUT CONTRAST  TECHNIQUE: Multiplanar, multisequence MR imaging of the lumbar spine was performed. No intravenous contrast was administered.  COMPARISON:  None.  FINDINGS: Segmentation:  Standard lumbar numbering  Alignment:  Physiologic.  Vertebrae:  No fracture, evidence of discitis, or bone lesion.  Conus medullaris and cauda equina: Conus extends to the L1-2 level. Conus and cauda equina appear normal.  Paraspinal and other soft tissues: Negative  Disc levels:  T12- L1: Unremarkable.  L1-L2: Unremarkable.  L2-L3: Minimal rightward disc bulging  L3-L4: Rightward disc bulging with annular fissure and foraminal protrusion that may contact but does not deflect the L3 nerve root, see sagittal T2 weighted imaging with annotation.  L4-L5: Disc narrowing with slight discogenic edema. Central disc protrusion that is broad and contacts but does not compress the L5 nerve roots.  L5-S1:Small left paracentral annular fissure and protrusion, noncompressive.  IMPRESSION: 1. Multilevel mild disc degeneration with annular fissures and small protrusions. 2. L4-5 shallow central protrusion that contacts but does not compress the L5 nerve roots. 3. L3-4 right foraminal protrusion which may contact but does not displace the L3 nerve root.   Electronically Signed   By: Monte Fantasia M.D.   On: 07/10/2019 13:38     Objective:  VS:  HT:    WT:   BMI:     BP:(!) 146/96  HR:74bpm  TEMP: ( )  RESP:  Physical Exam Constitutional:      General: She is not in acute distress.    Appearance: Normal appearance. She is not ill-appearing.  HENT:     Head: Normocephalic and atraumatic.     Right Ear: External ear normal.     Left Ear: External ear normal.  Eyes:     Extraocular Movements: Extraocular movements intact.  Cardiovascular:     Rate and Rhythm: Normal rate.     Pulses: Normal pulses.  Musculoskeletal:     Right  lower leg: No edema.     Left lower leg: No edema.     Comments: Patient has good distal strength with no pain over the greater trochanters.  No clonus or focal weakness.  Skin:    Findings: No erythema, lesion or rash.  Neurological:     General: No focal deficit present.     Mental Status: She is alert and oriented to person, place, and time.     Sensory: No sensory deficit.     Motor: No weakness or abnormal muscle tone.     Coordination: Coordination normal.  Psychiatric:        Mood and Affect: Mood normal.        Behavior: Behavior normal.     Ortho Exam Imaging: XR C-ARM NO REPORT  Result Date: 09/02/2019 Please see Notes tab for imaging impression.

## 2019-09-03 NOTE — Procedures (Signed)
Lumbar Epidural Steroid Injection - Interlaminar Approach with Fluoroscopic Guidance  Patient: Alexis Bautista      Date of Birth: Jun 07, 1985 MRN: [id number] PCP: Ronnald Nian, DO      Visit Date: 09/02/2019   Universal Protocol:     Consent Given By: the patient  Position: PRONE  Additional Comments: Vital signs were monitored before and after the procedure. Patient was prepped and draped in the usual sterile fashion. The correct patient, procedure, and site was verified.   Injection Procedure Details:  Procedure Site One Meds Administered:  Meds ordered this encounter  Medications  . methylPREDNISolone acetate (DEPO-MEDROL) injection 40 mg     Laterality: Left  Location/Site:  L5-S1  Needle size: 20 G  Needle type: Tuohy  Needle Placement: Paramedian epidural  Findings:   -Comments: Excellent flow of contrast into the epidural space.  Procedure Details: Using a paramedian approach from the side mentioned above, the region overlying the inferior lamina was localized under fluoroscopic visualization and the soft tissues overlying this structure were infiltrated with 4 ml. of 1% Lidocaine without Epinephrine. The Tuohy needle was inserted into the epidural space using a paramedian approach.   The epidural space was localized using loss of resistance along with lateral and bi-planar fluoroscopic views.  After negative aspirate for air, blood, and CSF, a 2 ml. volume of Isovue-250 was injected into the epidural space and the flow of contrast was observed. Radiographs were obtained for documentation purposes.    The injectate was administered into the level noted above.   Additional Comments:  The patient tolerated the procedure well Dressing: 2 x 2 sterile gauze and Band-Aid    Post-procedure details: Patient was observed during the procedure. Post-procedure instructions were reviewed.  Patient left the clinic in stable condition.

## 2019-09-16 ENCOUNTER — Telehealth: Payer: Self-pay | Admitting: Orthopaedic Surgery

## 2019-09-16 NOTE — Telephone Encounter (Signed)
Alexis Bautista from [facility] call ed requesting a call back. Alexis Bautista is requesting progress notes that was faxed over and returned signed by physician. Alexis Bautista phone number is [phone]

## 2019-09-22 ENCOUNTER — Ambulatory Visit: Payer: BC Managed Care – PPO | Admitting: Orthopaedic Surgery

## 2019-09-22 ENCOUNTER — Encounter: Payer: Self-pay | Admitting: Orthopaedic Surgery

## 2019-09-22 ENCOUNTER — Other Ambulatory Visit: Payer: Self-pay

## 2019-09-22 DIAGNOSIS — M545 Low back pain, unspecified: Secondary | ICD-10-CM

## 2019-09-22 DIAGNOSIS — G8929 Other chronic pain: Secondary | ICD-10-CM

## 2019-09-22 NOTE — Progress Notes (Signed)
The patient is following up after having a left-sided epidural steroid injection in the lower lumbar spine by Dr. Ernestina Patches.  She is only 34 years old.  She has been dealing with chronic low back pain for years.  Today she denies any radicular symptoms but she says the injections did not provide any relief from Dr. Ernestina Patches.  Physical therapy has provided no relief as well.  Today she is having stabbing pain in her lower lumbar spine and she points to the midline in the paraspinal muscles as a source of her pain.  She is tearful in the office.  She does have a chiropractic appointment later today.  On exam she has 5 out of 5 strength of her bilateral lower extremities.  She does not have a positive straight leg raise.  Her sensation is normal as well.  At this point I would like to please send her to neurosurgery for an evaluation of her lumbar spine given the stenosis and the disc herniation we are seeing but also they may help guide her in terms of whether she would benefit more from facet joint injections versus an operative intervention.  She agrees with this treatment plan.  We will work on making that referral happen.

## 2019-12-01 ENCOUNTER — Other Ambulatory Visit: Payer: Self-pay

## 2019-12-02 ENCOUNTER — Ambulatory Visit (INDEPENDENT_AMBULATORY_CARE_PROVIDER_SITE_OTHER): Payer: BC Managed Care – PPO | Admitting: Family Medicine

## 2019-12-02 ENCOUNTER — Encounter: Payer: Self-pay | Admitting: Family Medicine

## 2019-12-02 VITALS — BP 130/90 | HR 83 | Temp 97.7°F | Ht 71.25 in | Wt 255.0 lb

## 2019-12-02 DIAGNOSIS — E669 Obesity, unspecified: Secondary | ICD-10-CM

## 2019-12-02 DIAGNOSIS — D229 Melanocytic nevi, unspecified: Secondary | ICD-10-CM

## 2019-12-02 DIAGNOSIS — F3160 Bipolar disorder, current episode mixed, unspecified: Secondary | ICD-10-CM

## 2019-12-02 DIAGNOSIS — Z23 Encounter for immunization: Secondary | ICD-10-CM

## 2019-12-02 DIAGNOSIS — F419 Anxiety disorder, unspecified: Secondary | ICD-10-CM

## 2019-12-02 DIAGNOSIS — Z0000 Encounter for general adult medical examination without abnormal findings: Secondary | ICD-10-CM

## 2019-12-02 NOTE — Patient Instructions (Addendum)
Health Maintenance Due  Topic Date Due  . Hepatitis C Screening  Never done  . COVID-19 Vaccine (1) Never done  . HIV Screening  Never done  . TETANUS/TDAP  Never done  . PAP SMEAR-Modifier  Never done    Depression screen PHQ 2/9 03/25/2019  Decreased Interest 0  Down, Depressed, Hopeless 1  PHQ - 2 Score 1    Health Maintenance, Female Adopting a healthy lifestyle and getting preventive care are important in promoting health and wellness. Ask your health care provider about:  The right schedule for you to have regular tests and exams.  Things you can do on your own to prevent diseases and keep yourself healthy. What should I know about diet, weight, and exercise? Eat a healthy diet   Eat a diet that includes plenty of vegetables, fruits, low-fat dairy products, and lean protein.  Do not eat a lot of foods that are high in solid fats, added sugars, or sodium. Maintain a healthy weight Body mass index (BMI) is used to identify weight problems. It estimates body fat based on height and weight. Your health care provider can help determine your BMI and help you achieve or maintain a healthy weight. Get regular exercise Get regular exercise. This is one of the most important things you can do for your health. Most adults should:  Exercise for at least 150 minutes each week. The exercise should increase your heart rate and make you sweat (moderate-intensity exercise).  Do strengthening exercises at least twice a week. This is in addition to the moderate-intensity exercise.  Spend less time sitting. Even light physical activity can be beneficial. Watch cholesterol and blood lipids Have your blood tested for lipids and cholesterol at 33 years of age, then have this test every 5 years. Have your cholesterol levels checked more often if:  Your lipid or cholesterol levels are high.  You are older than 33 years of age.  You are at high risk for heart disease. What should I know  about cancer screening? Depending on your health history and family history, you may need to have cancer screening at various ages. This may include screening for:  Breast cancer.  Cervical cancer.  Colorectal cancer.  Skin cancer.  Lung cancer. What should I know about heart disease, diabetes, and high blood pressure? Blood pressure and heart disease  High blood pressure causes heart disease and increases the risk of stroke. This is more likely to develop in people who have high blood pressure readings, are of African descent, or are overweight.  Have your blood pressure checked: ? Every 3-5 years if you are 65-15 years of age. ? Every year if you are 45 years old or older. Diabetes Have regular diabetes screenings. This checks your fasting blood sugar level. Have the screening done:  Once every three years after age 42 if you are at a normal weight and have a low risk for diabetes.  More often and at a younger age if you are overweight or have a high risk for diabetes. What should I know about preventing infection? Hepatitis B If you have a higher risk for hepatitis B, you should be screened for this virus. Talk with your health care provider to find out if you are at risk for hepatitis B infection. Hepatitis C Testing is recommended for:  Everyone born from [birth year] through 1964.  Anyone with known risk factors for hepatitis C. Sexually transmitted infections (STIs)  Get screened for STIs, including gonorrhea and  chlamydia, if: ? You are sexually active and are younger than 33 years of age. ? You are older than 33 years of age and your health care provider tells you that you are at risk for this type of infection. ? Your sexual activity has changed since you were last screened, and you are at increased risk for chlamydia or gonorrhea. Ask your health care provider if you are at risk.  Ask your health care provider about whether you are at high risk for HIV. Your health care  provider may recommend a prescription medicine to help prevent HIV infection. If you choose to take medicine to prevent HIV, you should first get tested for HIV. You should then be tested every 3 months for as long as you are taking the medicine. Pregnancy  If you are about to stop having your period (premenopausal) and you may become pregnant, seek counseling before you get pregnant.  Take 400 to 800 micrograms (mcg) of folic acid every day if you become pregnant.  Ask for birth control (contraception) if you want to prevent pregnancy. Osteoporosis and menopause Osteoporosis is a disease in which the bones lose minerals and strength with aging. This can result in bone fractures. If you are 13 years old or older, or if you are at risk for osteoporosis and fractures, ask your health care provider if you should:  Be screened for bone loss.  Take a calcium or vitamin D supplement to lower your risk of fractures.  Be given hormone replacement therapy (HRT) to treat symptoms of menopause. Follow these instructions at home: Lifestyle  Do not use any products that contain nicotine or tobacco, such as cigarettes, e-cigarettes, and chewing tobacco. If you need help quitting, ask your health care provider.  Do not use street drugs.  Do not share needles.  Ask your health care provider for help if you need support or information about quitting drugs. Alcohol use  Do not drink alcohol if: ? Your health care provider tells you not to drink. ? You are pregnant, may be pregnant, or are planning to become pregnant.  If you drink alcohol: ? Limit how much you use to 0-1 drink a day. ? Limit intake if you are breastfeeding.  Be aware of how much alcohol is in your drink. In the [place] one drink equals one 12 oz bottle of beer (355 mL), one 5 oz glass of wine (148 mL), or one 1 oz glass of hard liquor (44 mL). General instructions  Schedule regular health, dental, and eye exams.  Stay current  with your vaccines.  Tell your health care provider if: ? You often feel depressed. ? You have ever been abused or do not feel safe at home. Summary  Adopting a healthy lifestyle and getting preventive care are important in promoting health and wellness.  Follow your health care provider's instructions about healthy diet, exercising, and getting tested or screened for diseases.  Follow your health care provider's instructions on monitoring your cholesterol and blood pressure. This information is not intended to replace advice given to you by your health care provider. Make sure you discuss any questions you have with your health care provider. Document Revised: 04/29/2018 Document Reviewed: 04/29/2018 Elsevier Patient Education  2020 [employer].

## 2019-12-02 NOTE — Progress Notes (Signed)
Alexis Bautista is a 34 y.o. female  Chief Complaint  Patient presents with  . Annual Exam    Pt not fasting for lab work today.  Pt is due for Tdap, will receive today.  Pt c/o having some moles and skin tags.  Pt UTD on pap, she has a GYN.    HPI: Alexis Bautista is a 34 y.o. female here for annual CPE, labs. She is not fasting and will RTO for lab appt.  She is due for Tdap.  She complains of moles on Rt upper back and Rt upper arm. Change in size, texture. Also skin tags B/L axilla.    Last PAP: pt follows with GYN [facility] - [facility]) and last PAP in 03/2019.   Diet/Exercise: requests referral nutritionist or weight loss specialist Dentist: due for exam Vision: has appt in 12/2019  Med refills needed today? none   History reviewed. No pertinent past medical history.  History reviewed. No pertinent surgical history.  Social History   Socioeconomic History  . Marital status: Single    Spouse name: Not on file  . Number of children: Not on file  . Years of education: Not on file  . Highest education level: Not on file  Occupational History  . Not on file  Tobacco Use  . Smoking status: Never Smoker  . Smokeless tobacco: Never Used  Vaping Use  . Vaping Use: Never used  Substance and Sexual Activity  . Alcohol use: Yes  . Drug use: Never  . Sexual activity: Not on file  Other Topics Concern  . Not on file  Social History Narrative  . Not on file   Social Determinants of Health   Financial Resource Strain:   . Difficulty of Paying Living Expenses:   Food Insecurity:   . Worried About Charity fundraiser in the Last Year:   . Arboriculturist in the Last Year:   Transportation Needs:   . Film/video editor (Medical):   Marland Kitchen Lack of Transportation (Non-Medical):   Physical Activity:   . Days of Exercise per Week:   . Minutes of Exercise per Session:   Stress:   . Feeling of Stress :   Social Connections:   . Frequency of Communication with Friends and  Family:   . Frequency of Social Gatherings with Friends and Family:   . Attends Religious Services:   . Active Member of Clubs or Organizations:   . Attends Archivist Meetings:   Marland Kitchen Marital Status:   Intimate Partner Violence:   . Fear of Current or Ex-Partner:   . Emotionally Abused:   Marland Kitchen Physically Abused:   . Sexually Abused:     Family History  Problem Relation Age of Onset  . Cancer Mother   . Hypertension Father      There is no immunization history for the selected administration types on file for this patient.  Outpatient Encounter Medications as of 12/02/2019  Medication Sig  . BLISOVI 24 FE 1-20 MG-MCG(24) tablet Take 1 tablet by mouth daily.  Marland Kitchen lithium carbonate (LITHOBID) 300 MG CR tablet Take 2 tablets (600 mg total) by mouth 2 (two) times daily.  . sertraline (ZOLOFT) 50 MG tablet Take 1.5 tablets (75 mg total) by mouth daily.  . traZODone (DESYREL) 50 MG tablet Take 1-2 tablets (50-100 mg total) by mouth at bedtime.  . naproxen (NAPROSYN) 500 MG tablet TAKE 1 TABLET (500 MG TOTAL) BY MOUTH 2 (TWO) TIMES DAILY  WITH A MEAL. (Patient not taking: Reported on 12/02/2019)   No facility-administered encounter medications on file as of 12/02/2019.     ROS: Gen: no fever, chills  Skin: no rash, itching ENT: no ear pain, ear drainage, nasal congestion, rhinorrhea, sinus pressure, sore throat Eyes: no blurry vision, double vision Resp: no cough, wheeze,SOB CV: no CP, palpitations, LE edema,  GI: + heartburn, no n/v/d/c, abd pain GU: no dysuria, urgency, frequency, hematuria MSK: no joint pain, myalgias,+ back pain Neuro: no dizziness, headache, weakness, vertigo Psych: no depression, anxiety, insomnia   Allergies  Allergen Reactions  . Coconut Oil   . Fish Allergy Swelling  . Peanut Oil   . Pineapple   . Shellfish Allergy     BP 130/90 (BP Location: Left Arm, Patient Position: Sitting, Cuff Size: Normal)   Pulse 83   Temp 97.7 F (36.5 C)  (Temporal)   Ht 5' 11.25" (1.81 m)   Wt 255 lb (115.7 kg)   LMP 11/11/2019   SpO2 96%   BMI 35.32 kg/m   Physical Exam Constitutional:      General: She is not in acute distress.    Appearance: She is well-developed.  HENT:     Head: Normocephalic and atraumatic.     Right Ear: Tympanic membrane and ear canal normal.     Left Ear: Tympanic membrane and ear canal normal.     Nose: Nose normal.  Eyes:     Conjunctiva/sclera: Conjunctivae normal.     Pupils: Pupils are equal, round, and reactive to light.  Neck:     Thyroid: No thyromegaly.  Cardiovascular:     Rate and Rhythm: Normal rate and regular rhythm.     Heart sounds: Normal heart sounds. No murmur heard.   Pulmonary:     Effort: Pulmonary effort is normal. No respiratory distress.     Breath sounds: Normal breath sounds. No wheezing or rhonchi.  Abdominal:     General: Bowel sounds are normal. There is no distension.     Palpations: Abdomen is soft. There is no mass.     Tenderness: There is no abdominal tenderness.  Musculoskeletal:     Cervical back: Neck supple.  Lymphadenopathy:     Cervical: No cervical adenopathy.  Skin:    General: Skin is warm and dry.       Neurological:     Mental Status: She is alert and oriented to person, place, and time.     Motor: No abnormal muscle tone.     Coordination: Coordination normal.  Psychiatric:        Behavior: Behavior normal.      A/P:  1. Annual physical exam - due for dental exam, UTD on vision exam - UTD PAP - discussed importance of regular CV exercise, healthy diet, adequate sleep - immunizations UTD - ALT; Future - AST; Future - Basic metabolic panel; Future - CBC; Future - Lipid panel; Future - next CPE in 1 year  2. Anxiety - controlled, at baseline  3. Bipolar affective, mixed ([place]) - stable, controlled - Lithium level; Future  4. Need for Tdap vaccination - Tdap vaccine greater than or equal to 7yo IM  5. Obesity, Class II, BMI  35-39.9 - Amb Ref to Medical Weight Management  6. Atypical nevi - Ambulatory referral to Dermatology   This visit occurred during the SARS-CoV-2 public health emergency.  Safety protocols were in place, including screening questions prior to the visit, additional usage of staff PPE, and  extensive cleaning of exam room while observing appropriate contact time as indicated for disinfecting solutions.

## 2019-12-06 ENCOUNTER — Other Ambulatory Visit: Payer: Self-pay

## 2019-12-07 ENCOUNTER — Ambulatory Visit (INDEPENDENT_AMBULATORY_CARE_PROVIDER_SITE_OTHER): Payer: BC Managed Care – PPO

## 2019-12-07 ENCOUNTER — Other Ambulatory Visit (INDEPENDENT_AMBULATORY_CARE_PROVIDER_SITE_OTHER): Payer: BC Managed Care – PPO

## 2019-12-07 DIAGNOSIS — Z23 Encounter for immunization: Secondary | ICD-10-CM

## 2019-12-07 DIAGNOSIS — F3160 Bipolar disorder, current episode mixed, unspecified: Secondary | ICD-10-CM | POA: Diagnosis not present

## 2019-12-07 DIAGNOSIS — Z0000 Encounter for general adult medical examination without abnormal findings: Secondary | ICD-10-CM | POA: Diagnosis not present

## 2019-12-07 LAB — LIPID PANEL
Cholesterol: 205 mg/dL — ABNORMAL HIGH (ref 0–200)
HDL: 42.4 mg/dL (ref >39.00)
LDL Cholesterol: 123 mg/dL — ABNORMAL HIGH (ref 0–99)
NonHDL: 162.12
Total CHOL/HDL Ratio: 5
Triglycerides: 195 mg/dL — ABNORMAL HIGH (ref 0.0–149.0)
VLDL: 39 mg/dL (ref 0.0–40.0)

## 2019-12-07 LAB — ALT: ALT: 77 U/L — ABNORMAL HIGH (ref 0–35)

## 2019-12-07 LAB — CBC
HCT: 40.6 % (ref 36.0–46.0)
Hemoglobin: 13.6 g/dL (ref 12.0–15.0)
MCHC: 33.4 g/dL (ref 30.0–36.0)
MCV: 87.5 fl (ref 78.0–100.0)
Platelets: 269 10*3/uL (ref 150.0–400.0)
RBC: 4.65 Mil/uL (ref 3.87–5.11)
RDW: 12.9 % (ref 11.5–15.5)
WBC: 8.7 10*3/uL (ref 4.0–10.5)

## 2019-12-07 LAB — AST: AST: 41 U/L — ABNORMAL HIGH (ref 0–37)

## 2019-12-07 LAB — BASIC METABOLIC PANEL
BUN: 15 mg/dL (ref 6–23)
CO2: 26 mEq/L (ref 19–32)
Calcium: 10.2 mg/dL (ref 8.4–10.5)
Chloride: 105 mEq/L (ref 96–112)
Creatinine, Ser: 0.76 mg/dL (ref 0.40–1.20)
GFR: 87.72 mL/min (ref >60.00)
Glucose, Bld: 89 mg/dL (ref 70–99)
Potassium: 4.2 mEq/L (ref 3.5–5.1)
Sodium: 138 mEq/L (ref 135–145)

## 2019-12-07 NOTE — Progress Notes (Signed)
Pt came in today to receive a TB skin test placement to be read this Thursday at [facility]. Pt tolerated the placement well and scheduled to come back at 9am to be read.

## 2019-12-08 LAB — LITHIUM LEVEL: Lithium Lvl: 0.8 mmol/L (ref 0.6–1.2)

## 2019-12-09 ENCOUNTER — Other Ambulatory Visit: Payer: Self-pay

## 2019-12-09 ENCOUNTER — Ambulatory Visit: Payer: BC Managed Care – PPO

## 2019-12-09 DIAGNOSIS — Z23 Encounter for immunization: Secondary | ICD-10-CM

## 2019-12-09 LAB — TB SKIN TEST
Induration: 0 mm
TB Skin Test: NEGATIVE

## 2019-12-22 ENCOUNTER — Telehealth: Payer: Self-pay | Admitting: Family Medicine

## 2019-12-22 NOTE — Telephone Encounter (Signed)
Patient is calling and wanted to see if she can get documentation to show her job results of her TB skin test, please advise. CB is [phone]

## 2019-12-23 NOTE — Telephone Encounter (Signed)
Spoke with pt to inform her that document is ready for pick up.

## 2020-02-20 ENCOUNTER — Other Ambulatory Visit: Payer: Self-pay | Admitting: Family Medicine

## 2020-02-20 DIAGNOSIS — F3160 Bipolar disorder, current episode mixed, unspecified: Secondary | ICD-10-CM

## 2020-02-21 NOTE — Telephone Encounter (Signed)
Baldo Ash please advise as doc of the day.  Pt asking for refill on:  Lithium Carbonate ER 300mg  TB 360 tabs   1-refill Last fill-08/18/2019 Last OV-12/02/2019

## 2020-04-10 ENCOUNTER — Other Ambulatory Visit: Payer: Self-pay

## 2020-04-10 ENCOUNTER — Other Ambulatory Visit: Payer: BC Managed Care – PPO

## 2020-04-11 ENCOUNTER — Other Ambulatory Visit (INDEPENDENT_AMBULATORY_CARE_PROVIDER_SITE_OTHER): Payer: BC Managed Care – PPO

## 2020-04-11 DIAGNOSIS — R748 Abnormal levels of other serum enzymes: Secondary | ICD-10-CM | POA: Diagnosis not present

## 2020-04-11 LAB — ALT: ALT: 64 U/L — ABNORMAL HIGH (ref 0–35)

## 2020-04-11 LAB — AST: AST: 31 U/L (ref 0–37)

## 2020-04-20 NOTE — Progress Notes (Signed)
Left message on voicemail to call office.  

## 2020-05-22 ENCOUNTER — Other Ambulatory Visit: Payer: Self-pay | Admitting: Nurse Practitioner

## 2020-05-22 DIAGNOSIS — F3160 Bipolar disorder, current episode mixed, unspecified: Secondary | ICD-10-CM

## 2020-05-23 NOTE — Telephone Encounter (Signed)
Please advise 

## 2020-09-02 ENCOUNTER — Other Ambulatory Visit: Payer: Self-pay | Admitting: Family Medicine

## 2020-09-02 DIAGNOSIS — F419 Anxiety disorder, unspecified: Secondary | ICD-10-CM

## 2020-09-04 NOTE — Telephone Encounter (Signed)
Last OV 12/02/19 Last fill 08/18/19  #135/3

## 2020-09-13 ENCOUNTER — Other Ambulatory Visit: Payer: Self-pay | Admitting: Family Medicine

## 2020-09-13 DIAGNOSIS — F419 Anxiety disorder, unspecified: Secondary | ICD-10-CM

## 2020-09-13 NOTE — Telephone Encounter (Signed)
Refill request for: Trazadone 50 mg LR 08/08/19, #90, 3 rf LOV 12/02/19 FOV  None scheduled.   Please review and advise.  Thanks. Dm/cma

## 2020-11-27 ENCOUNTER — Other Ambulatory Visit: Payer: Self-pay | Admitting: Family Medicine

## 2020-11-27 DIAGNOSIS — F3160 Bipolar disorder, current episode mixed, unspecified: Secondary | ICD-10-CM

## 2020-12-11 ENCOUNTER — Other Ambulatory Visit: Payer: Self-pay | Admitting: Family Medicine

## 2020-12-11 DIAGNOSIS — F419 Anxiety disorder, unspecified: Secondary | ICD-10-CM

## 2021-02-09 ENCOUNTER — Encounter: Payer: Self-pay | Admitting: Family Medicine

## 2021-02-09 ENCOUNTER — Telehealth (INDEPENDENT_AMBULATORY_CARE_PROVIDER_SITE_OTHER): Payer: BC Managed Care – PPO | Admitting: Family Medicine

## 2021-02-09 ENCOUNTER — Other Ambulatory Visit: Payer: Self-pay

## 2021-02-09 VITALS — HR 104 | Temp 99.6°F | Wt 250.0 lb

## 2021-02-09 DIAGNOSIS — B349 Viral infection, unspecified: Secondary | ICD-10-CM

## 2021-02-09 NOTE — Progress Notes (Signed)
Established Patient Office Visit  Subjective:  Patient ID: Alexis Bautista, female    DOB: 02/09/1986  Age: 35 y.o. MRN: [id number]  CC:  Chief Complaint  Patient presents with   Acute Visit    Pt c/o sore throat swollen lymph nodes started last night, cough  feeling calmy low energy x 1wk ,Ibuprohen cough drops to combat sx covid negative     HPI Alexis Bautista presents for evaluation of a 1 day history of sorethroat, tender lymph nodes, feels warm, cough, arthralgias and myalgias.  She is experiencing shortness of breath but denies tightness in her chest wheezing or history of asthma.  No chest pain or nausea.  Denies diarrhea or change in taste.  She has been experiencing her usual headache.  She has completed the COVID vaccination series with a booster.  Appetite is normal there is no nausea or vomiting.  She is a [place].  She lives alone.  History reviewed. No pertinent past medical history.  History reviewed. No pertinent surgical history.  Family History  Problem Relation Age of Onset   Cancer Mother    Hypertension Father     Social History   Socioeconomic History   Marital status: Single    Spouse name: Not on file   Number of children: Not on file   Years of education: Not on file   Highest education level: Not on file  Occupational History   Not on file  Tobacco Use   Smoking status: Never   Smokeless tobacco: Never  Vaping Use   Vaping Use: Never used  Substance and Sexual Activity   Alcohol use: Yes   Drug use: Never   Sexual activity: Not on file  Other Topics Concern   Not on file  Social History Narrative   Not on file   Social Determinants of Health   Financial Resource Strain: Not on file  Food Insecurity: Not on file  Transportation Needs: Not on file  Physical Activity: Not on file  Stress: Not on file  Social Connections: Not on file  Intimate Partner Violence: Not on file    Outpatient Medications Prior to Visit  Medication  Sig Dispense Refill   BLISOVI 24 FE 1-20 MG-MCG(24) tablet Take 1 tablet by mouth daily.     lithium carbonate (LITHOBID) 300 MG CR tablet TAKE 2 TABLETS BY MOUTH TWICE A DAY 360 tablet 1   sertraline (ZOLOFT) 50 MG tablet TAKE 1 AND 1/2 TABLETS BY MOUTH DAILY 135 tablet 2   traZODone (DESYREL) 50 MG tablet TAKE 1 TO 2 TABLETS BY MOUTH AT BEDTIME 180 tablet 1   No facility-administered medications prior to visit.    Allergies  Allergen Reactions   Justicia Adhatoda ([place]abar Nut Tree) [Justicia Adhatoda] Anaphylaxis    Swelling of throat and tonuge   Coconut Oil    Fish Allergy Swelling   Pineapple    Shellfish Allergy     ROS Review of Systems  Constitutional:  Positive for diaphoresis and fatigue. Negative for fever and unexpected weight change.  HENT:  Positive for congestion, sore throat and voice change. Negative for postnasal drip.   Respiratory:  Positive for shortness of breath. Negative for chest tightness and wheezing.   Cardiovascular:  Negative for chest pain.  Gastrointestinal:  Negative for abdominal pain, diarrhea, nausea and vomiting.  Musculoskeletal:  Positive for arthralgias and myalgias.  Neurological:  Positive for headaches.     Objective:    Physical Exam Vitals and nursing  note reviewed.  Constitutional:      General: She is not in acute distress.    Appearance: Normal appearance. She is not ill-appearing, toxic-appearing or diaphoretic.  HENT:     Head: Normocephalic and atraumatic.  Eyes:     General: No scleral icterus.       Right eye: No discharge.        Left eye: No discharge.     Extraocular Movements: Extraocular movements intact.     Conjunctiva/sclera: Conjunctivae normal.  Pulmonary:     Effort: Pulmonary effort is normal.     Comments: Appears to be having no difficulty breathing.  She is speaking in complete sentences. Neurological:     Mental Status: She is alert and oriented to person, place, and time.  Psychiatric:        Mood  and Affect: Mood normal.        Behavior: Behavior normal.    Pulse (!) 104   Temp 99.6 F (37.6 C) (Temporal)   Wt 250 lb (113.4 kg)   SpO2 98%   BMI 34.62 kg/m  Wt Readings from Last 3 Encounters:  02/09/21 250 lb (113.4 kg)  12/02/19 255 lb (115.7 kg)  08/18/19 251 lb 12.8 oz (114.2 kg)     Health Maintenance Due  Topic Date Due   COVID-19 Vaccine (1) Never done   HIV Screening  Never done   Hepatitis C Screening  Never done   INFLUENZA VACCINE  Never done    There are no preventive care reminders to display for this patient.  No results found for: TSH Lab Results  Component Value Date   WBC 8.7 12/07/2019   HGB 13.6 12/07/2019   HCT 40.6 12/07/2019   MCV 87.5 12/07/2019   PLT 269.0 12/07/2019   Lab Results  Component Value Date   NA 138 12/07/2019   K 4.2 12/07/2019   CO2 26 12/07/2019   GLUCOSE 89 12/07/2019   BUN 15 12/07/2019   CREATININE 0.76 12/07/2019   AST 31 04/11/2020   ALT 64 (H) 04/11/2020   CALCIUM 10.2 12/07/2019   GFR 87.72 12/07/2019   Lab Results  Component Value Date   CHOL 205 (H) 12/07/2019   Lab Results  Component Value Date   HDL 42.40 12/07/2019   Lab Results  Component Value Date   LDLCALC 123 (H) 12/07/2019   Lab Results  Component Value Date   TRIG 195.0 (H) 12/07/2019   Lab Results  Component Value Date   CHOLHDL 5 12/07/2019   No results found for: HGBA1C    Assessment & Plan:   Problem List Items Addressed This Visit       Other   Viral syndrome - Primary   Relevant Orders   Novel Coronavirus, NAA ([facility])   Culture, Group A Strep    No orders of the defined types were placed in this encounter.   Follow-up: No follow-ups on file.  Advise emergent evaluation if difficulty breathing increases.  Follow-up later next week if not improving.  Advise quarantine for 5 days.  Will come to the clinic for PCR COVID test and strep culture.  Libby Maw, MD  Virtual Visit via Video Note  I  connected with Alexis Bautista on 02/09/21 at 10:30 AM EDT by a video enabled telemedicine application and verified that I am speaking with the correct person using two identifiers.  Location: Patient: home alone.  Provider: work   I discussed the limitations of evaluation and management  by telemedicine and the availability of in person appointments. The patient expressed understanding and agreed to proceed.  History of Present Illness:    Observations/Objective:   Assessment and Plan:   Follow Up Instructions:    I discussed the assessment and treatment plan with the patient. The patient was provided an opportunity to ask questions and all were answered. The patient agreed with the plan and demonstrated an understanding of the instructions.   The patient was advised to call back or seek an in-person evaluation if the symptoms worsen or if the condition fails to improve as anticipated.  I provided 22 minutes of non-face-to-face time during this encounter.   Libby Maw, MD

## 2021-02-09 NOTE — Addendum Note (Signed)
Addended by: Beryle Lathe S on: 02/09/2021 12:00 PM   Modules accepted: Orders

## 2021-02-12 ENCOUNTER — Encounter: Payer: Self-pay | Admitting: Family Medicine

## 2021-02-12 DIAGNOSIS — J029 Acute pharyngitis, unspecified: Secondary | ICD-10-CM

## 2021-02-13 MED ORDER — AMOXICILLIN 875 MG PO TABS
875.0000 mg | ORAL_TABLET | Freq: Two times a day (BID) | ORAL | 0 refills | Status: AC
Start: 2021-02-13 — End: 2021-02-23

## 2021-02-22 ENCOUNTER — Telehealth: Payer: Self-pay

## 2021-02-22 ENCOUNTER — Ambulatory Visit (HOSPITAL_COMMUNITY)
Admission: EM | Admit: 2021-02-22 | Discharge: 2021-02-22 | Disposition: A | Discharge status: Home / Self Care | Payer: BC Managed Care – PPO | Attending: Emergency Medicine | Admitting: Emergency Medicine

## 2021-02-22 ENCOUNTER — Encounter (HOSPITAL_COMMUNITY): Payer: Self-pay | Admitting: Emergency Medicine

## 2021-02-22 ENCOUNTER — Other Ambulatory Visit: Payer: Self-pay

## 2021-02-22 DIAGNOSIS — T7840XA Allergy, unspecified, initial encounter: Secondary | ICD-10-CM

## 2021-02-22 MED ORDER — FAMOTIDINE 20 MG PO TABS: 20.0000 mg | ORAL_TABLET | Freq: Two times a day (BID) | ORAL | 0 refills | Status: DC

## 2021-02-22 MED ORDER — DEXAMETHASONE SODIUM PHOSPHATE 10 MG/ML IJ SOLN
INTRAMUSCULAR | Status: AC
  Filled 2021-02-22: qty 1

## 2021-02-22 MED ORDER — PREDNISONE 20 MG PO TABS: 40.0000 mg | ORAL_TABLET | Freq: Every day | ORAL | 0 refills | Status: AC

## 2021-02-22 MED ORDER — DEXAMETHASONE SODIUM PHOSPHATE 10 MG/ML IJ SOLN
10.0000 mg | Freq: Once | INTRAMUSCULAR | Status: AC
  Administered 2021-02-22: 10 mg via INTRAMUSCULAR

## 2021-02-22 MED ORDER — CETIRIZINE HCL 10 MG PO TABS: 10.0000 mg | ORAL_TABLET | Freq: Every day | ORAL | 0 refills | Status: DC

## 2021-02-22 NOTE — ED Triage Notes (Signed)
Pt is present today with hives on her legs, back, and head.Pt states that her itching started yesterday. Pt states she may be having an allergic reaction amoxicillin.

## 2021-02-22 NOTE — Telephone Encounter (Signed)
Patient called in today stating that she thinks she is having a reaction to taking Amoxicillian. She started taking it on 02/13/21 and broke out with hives all over her body  last night. She has taken Benadryl and used calamine lotion with little relief.   She hasn't taken anything else new or changed any detergents.   Please review and advise.  Thanks.  Dm/cma

## 2021-02-22 NOTE — Telephone Encounter (Signed)
Patient notified VIA phone and she will got o UC to be seen as recommended.  Dm/cma

## 2021-02-22 NOTE — ED Provider Notes (Addendum)
[facility]    CSN: [id number] Arrival date & time: 02/22/21  1436      History   Chief Complaint Chief Complaint  Patient presents with   Rash    HPI Alexis Bautista is a 35 y.o. female.   Patient here for evaluation of rash across entire body that started last night.  Reports that she has been taking amoxicillin for the past several days for strep throat and an ear infection.  Reports she had taken all but 2 pills.  Primary care provider was notified about rash and instructed patient to stop taking the amoxicillin immediately.  Reports taking Benadryl with minimal symptom relief.  Reports rash is very itchy.  Denies any throat itching or swelling and denies any shortness of breath or difficulty breathing.  Denies any fevers, chest pain, shortness of breath, N/V/D, numbness, tingling, weakness, abdominal pain, or headaches.    The history is provided by the patient.  Rash  History reviewed. No pertinent past medical history.  Patient Active Problem List   Diagnosis Date Noted   Viral syndrome 02/09/2021    History reviewed. No pertinent surgical history.  OB History   No obstetric history on file.      Home Medications    Prior to Admission medications   Medication Sig Start Date End Date Taking? Authorizing Provider  cetirizine (ZYRTEC) 10 MG tablet Take 1 tablet (10 mg total) by mouth daily. 02/22/21  Yes Pearson Forster, NP  famotidine (PEPCID) 20 MG tablet Take 1 tablet (20 mg total) by mouth 2 (two) times daily. 02/22/21  Yes Pearson Forster, NP  predniSONE (DELTASONE) 20 MG tablet Take 2 tablets (40 mg total) by mouth daily for 5 days. 02/22/21 02/27/21 Yes Pearson Forster, NP  amoxicillin (AMOXIL) 875 MG tablet Take 1 tablet (875 mg total) by mouth 2 (two) times daily for 10 days. 02/13/21 02/23/21  Libby Maw, MD  BLISOVI 24 FE 1-20 MG-MCG(24) tablet Take 1 tablet by mouth daily. 12/22/18   [provider]  lithium carbonate (LITHOBID) 300  MG CR tablet TAKE 2 TABLETS BY MOUTH TWICE A DAY 11/29/20   Cirigliano, Mary K, DO  sertraline (ZOLOFT) 50 MG tablet TAKE 1 AND 1/2 TABLETS BY MOUTH DAILY 09/06/20   Cirigliano, Mary K, DO  traZODone (DESYREL) 50 MG tablet TAKE 1 TO 2 TABLETS BY MOUTH AT BEDTIME 12/13/20   Cirigliano, Garvin Fila, DO    Family History Family History  Problem Relation Age of Onset   Cancer Mother    Hypertension Father     Social History Social History   Tobacco Use   Smoking status: Never   Smokeless tobacco: Never  Vaping Use   Vaping Use: Never used  Substance Use Topics   Alcohol use: Yes   Drug use: Never     Allergies   Justicia adhatoda (malabar nut tree) [justicia adhatoda], Coconut oil, Fish allergy, Pineapple, and Shellfish allergy   Review of Systems Review of Systems  Skin:  Positive for rash.  All other systems reviewed and are negative.   Physical Exam Triage Vital Signs ED Triage Vitals  Enc Vitals Group     BP 02/22/21 1507 114/76     Pulse Rate 02/22/21 1507 94     Resp 02/22/21 1507 18     Temp 02/22/21 1507 98.2 F (36.8 C)     Temp src --      SpO2 02/22/21 1507 95 %  Weight --      Height --      Head Circumference --      Peak Flow --      Pain Score 02/22/21 1505 6     Pain Loc --      Pain Edu? --      Excl. in [place]? --    No data found.  Updated Vital Signs BP 114/76   Pulse 94   Temp 98.2 F (36.8 C)   Resp 18   SpO2 95%   Visual Acuity Right Eye Distance:   Left Eye Distance:   Bilateral Distance:    Right Eye Near:   Left Eye Near:    Bilateral Near:     Physical Exam Vitals and nursing note reviewed.  Constitutional:      General: She is not in acute distress.    Appearance: Normal appearance. She is not ill-appearing, toxic-appearing or diaphoretic.  HENT:     Head: Normocephalic and atraumatic.  Eyes:     Conjunctiva/sclera: Conjunctivae normal.  Cardiovascular:     Rate and Rhythm: Normal rate.     Pulses: Normal pulses.      Heart sounds: Normal heart sounds.  Pulmonary:     Effort: Pulmonary effort is normal.     Breath sounds: Normal breath sounds.  Abdominal:     General: Abdomen is flat.  Musculoskeletal:        General: Normal range of motion.     Cervical back: Normal range of motion.  Skin:    General: Skin is warm and dry.     Findings: Rash (wide distribution across bilateral upper extremities, lower extremities, abdomen, and back of neck) present. Rash is urticarial.  Neurological:     General: No focal deficit present.     Mental Status: She is alert and oriented to person, place, and time.  Psychiatric:        Mood and Affect: Mood normal.          UC Treatments / Results  Labs (all labs ordered are listed, but only abnormal results are displayed) Labs Reviewed - No data to display  EKG   Radiology No results found.  Procedures Procedures (including critical care time)  Medications Ordered in UC Medications  dexamethasone (DECADRON) injection 10 mg (10 mg Intramuscular Given 02/22/21 1529)    Initial Impression / Assessment and Plan / UC Course  I have reviewed the triage vital signs and the nursing notes.  Pertinent labs & imaging results that were available during my care of the patient were reviewed by me and considered in my medical decision making (see chart for details).    Assessment negative for red flags or concerns.  Allergic reaction but no signs of anaphylaxis at this time.  Decadron IM administered in office.  Will treat with prednisone, famotidine, cetirizine for the next 5 days.  Patient instructed to stop amoxicillin immediately.  Recommend avoiding hot water when showering or bathing.  May use ice or cool compresses for comfort.  Use calamine lotion for comfort.  Follow-up with primary care for reevaluation.  Strict ED follow-up for any worsening symptoms. Final Clinical Impressions(s) / UC Diagnoses   Final diagnoses:  Allergic reaction, initial  encounter     Discharge Instructions      Take the prednisone daily for the next 5 days starting tomorrow.  Take this in the morning. Take the famotidine 1 pill twice a day for the next 5 days. Take the  cetirizine daily for the next 5 days.  Avoid using hot water when showering or bathing as this can make the itching worse.  You can apply ice intermittently for comfort.  Use a good emollient lotion such as CeraVe, Cetaphil, or Aquaphor.  It is best to apply lotion to wet skin after bathing.   You may use calamine lotion as needed for itching.  Follow-up with your primary care provider for reevaluation. Please go to the emergency room for any worsening symptoms including worsening rash, shortness of breath, throat/mouth/tongue swelling, or difficulty breathing.     ED Prescriptions     Medication Sig Dispense Auth. Provider   predniSONE (DELTASONE) 20 MG tablet Take 2 tablets (40 mg total) by mouth daily for 5 days. 10 tablet Pearson Forster, NP   famotidine (PEPCID) 20 MG tablet Take 1 tablet (20 mg total) by mouth 2 (two) times daily. 30 tablet Pearson Forster, NP   cetirizine (ZYRTEC) 10 MG tablet Take 1 tablet (10 mg total) by mouth daily. 5 tablet Pearson Forster, NP      PDMP not reviewed this encounter.   Pearson Forster, NP 02/22/21 1532    Pearson Forster, NP 02/22/21 [phone]

## 2021-02-22 NOTE — Discharge Instructions (Signed)
Take the prednisone daily for the next 5 days starting tomorrow.  Take this in the morning. Take the famotidine 1 pill twice a day for the next 5 days. Take the cetirizine daily for the next 5 days.  Avoid using hot water when showering or bathing as this can make the itching worse.  You can apply ice intermittently for comfort.  Use a good emollient lotion such as CeraVe, Cetaphil, or Aquaphor.  It is best to apply lotion to wet skin after bathing.   You may use calamine lotion as needed for itching.  Follow-up with your primary care provider for reevaluation. Please go to the emergency room for any worsening symptoms including worsening rash, shortness of breath, throat/mouth/tongue swelling, or difficulty breathing.

## 2021-04-16 ENCOUNTER — Encounter: Payer: Self-pay | Admitting: Family Medicine

## 2021-04-16 ENCOUNTER — Ambulatory Visit: Payer: BC Managed Care – PPO | Admitting: Family Medicine

## 2021-04-16 ENCOUNTER — Other Ambulatory Visit: Payer: Self-pay

## 2021-04-16 VITALS — BP 116/70 | Temp 97.7°F | Ht 71.0 in | Wt 253.8 lb

## 2021-04-16 DIAGNOSIS — Z5181 Encounter for therapeutic drug level monitoring: Secondary | ICD-10-CM | POA: Diagnosis not present

## 2021-04-16 DIAGNOSIS — F3160 Bipolar disorder, current episode mixed, unspecified: Secondary | ICD-10-CM

## 2021-04-16 DIAGNOSIS — R5383 Other fatigue: Secondary | ICD-10-CM | POA: Diagnosis not present

## 2021-04-16 DIAGNOSIS — F32A Depression, unspecified: Secondary | ICD-10-CM | POA: Insufficient documentation

## 2021-04-16 DIAGNOSIS — Z0000 Encounter for general adult medical examination without abnormal findings: Secondary | ICD-10-CM | POA: Insufficient documentation

## 2021-04-16 LAB — HCG, SERUM, QUALITATIVE: Preg, Serum: NEGATIVE

## 2021-04-16 NOTE — Progress Notes (Signed)
Established Patient Office Visit  Subjective:  Patient ID: Alexis Bautista, female    DOB: 03/12/1986  Age: 35 y.o. MRN: [id number]  CC:  Chief Complaint  Patient presents with   Fatigue    Fatigue x 2 months patient would like lithium levels checked.     HPI Alexis Bautista presents for follow-up of bipolar disorder, health check and fatigue.  Fatigue is been present approximately 2 months or since that time changed.  When she comes home in the afternoon she feels as though she barely has any energy and just wants to sit and rest.  It has been difficult for her to exercise.  Longstanding history of bipolar disorder well-controlled with 75 mg of sertraline and 600 mg twice daily of Lithobid.  Denies problems with mood swings but does admit to sadness and lack of interest.  She lives alone.  She is a first grade Education officer, [employer].  Family is located in [place].  Continues to see a therapist remotely.  Currently without social outlets.  A close friend has recently become mostly unavailable because she is dating somebody.  Weight has been stable.  History reviewed. No pertinent past medical history.  History reviewed. No pertinent surgical history.  Family History  Problem Relation Age of Onset   Cancer Mother    Hypertension Father     Social History   Socioeconomic History   Marital status: Single    Spouse name: Not on file   Number of children: Not on file   Years of education: Not on file   Highest education level: Not on file  Occupational History   Not on file  Tobacco Use   Smoking status: Never   Smokeless tobacco: Never  Vaping Use   Vaping Use: Never used  Substance and Sexual Activity   Alcohol use: Yes   Drug use: Never   Sexual activity: Not on file  Other Topics Concern   Not on file  Social History Narrative   Not on file   Social Determinants of Health   Financial Resource Strain: Not on file  Food Insecurity: Not on file  Transportation Needs: Not on file   Physical Activity: Not on file  Stress: Not on file  Social Connections: Not on file  Intimate Partner Violence: Not on file    Outpatient Medications Prior to Visit  Medication Sig Dispense Refill   BLISOVI 24 FE 1-20 MG-MCG(24) tablet Take 1 tablet by mouth daily.     lithium carbonate (LITHOBID) 300 MG CR tablet TAKE 2 TABLETS BY MOUTH TWICE A DAY 360 tablet 1   sertraline (ZOLOFT) 50 MG tablet TAKE 1 AND 1/2 TABLETS BY MOUTH DAILY 135 tablet 2   traZODone (DESYREL) 50 MG tablet TAKE 1 TO 2 TABLETS BY MOUTH AT BEDTIME 180 tablet 1   cetirizine (ZYRTEC) 10 MG tablet Take 1 tablet (10 mg total) by mouth daily. (Patient not taking: Reported on 04/16/2021) 5 tablet 0   famotidine (PEPCID) 20 MG tablet Take 1 tablet (20 mg total) by mouth 2 (two) times daily. (Patient not taking: Reported on 04/16/2021) 30 tablet 0   No facility-administered medications prior to visit.    Allergies  Allergen Reactions   Justicia Adhatoda (Malabar Nut Tree) [Justicia Adhatoda] Anaphylaxis    Swelling of throat and tonuge   Amoxil [Amoxicillin] Hives   Coconut Oil    Fish Allergy Swelling   Pineapple    Shellfish Allergy     ROS Review of Systems  Constitutional:  Positive for fatigue. Negative for diaphoresis, fever and unexpected weight change.  HENT: Negative.    Eyes:  Negative for photophobia and visual disturbance.  Respiratory:  Negative for cough, shortness of breath and wheezing.   Cardiovascular:  Negative for chest pain and palpitations.  Gastrointestinal:  Negative for abdominal pain, constipation, nausea and vomiting.  Genitourinary:  Negative for difficulty urinating, dysuria and frequency.  Musculoskeletal:  Negative for gait problem and joint swelling.  Neurological:  Negative for speech difficulty and weakness.     Objective:    Physical Exam Vitals and nursing note reviewed.  Constitutional:      General: She is not in acute distress.    Appearance: Normal appearance.  She is not ill-appearing, toxic-appearing or diaphoretic.  HENT:     Head: Normocephalic and atraumatic.     Right Ear: Tympanic membrane, ear canal and external ear normal.     Left Ear: Tympanic membrane, ear canal and external ear normal.     Mouth/Throat:     Mouth: Mucous membranes are moist.     Pharynx: Oropharynx is clear. No oropharyngeal exudate or posterior oropharyngeal erythema.  Eyes:     General: No scleral icterus.       Right eye: No discharge.        Left eye: No discharge.     Extraocular Movements: Extraocular movements intact.     Conjunctiva/sclera: Conjunctivae normal.     Pupils: Pupils are equal, round, and reactive to light.  Cardiovascular:     Rate and Rhythm: Normal rate and regular rhythm.  Pulmonary:     Effort: Pulmonary effort is normal.     Breath sounds: Normal breath sounds.  Abdominal:     General: Bowel sounds are normal.  Musculoskeletal:     Cervical back: No rigidity or tenderness.     Right lower leg: No edema.     Left lower leg: No edema.  Lymphadenopathy:     Cervical: No cervical adenopathy.  Skin:    General: Skin is warm and dry.  Neurological:     Mental Status: She is alert and oriented to person, place, and time.  Psychiatric:        Mood and Affect: Mood normal.        Behavior: Behavior normal.    BP 116/70 (BP Location: Left Arm, Patient Position: Sitting, Cuff Size: Large)   Temp 97.7 F (36.5 C) (Temporal)   Ht 5\' 11"  (1.803 m)   Wt 253 lb 12.8 oz (115.1 kg)   BMI 35.40 kg/m  Wt Readings from Last 3 Encounters:  04/16/21 253 lb 12.8 oz (115.1 kg)  02/09/21 250 lb (113.4 kg)  12/02/19 255 lb (115.7 kg)     Health Maintenance Due  Topic Date Due   Pneumococcal Vaccine 32-19 Years old (1 - PCV) Never done   HIV Screening  Never done   Hepatitis C Screening  Never done    There are no preventive care reminders to display for this patient.  No results found for: TSH Lab Results  Component Value Date    WBC 8.7 12/07/2019   HGB 13.6 12/07/2019   HCT 40.6 12/07/2019   MCV 87.5 12/07/2019   PLT 269.0 12/07/2019   Lab Results  Component Value Date   NA 138 12/07/2019   K 4.2 12/07/2019   CO2 26 12/07/2019   GLUCOSE 89 12/07/2019   BUN 15 12/07/2019   CREATININE 0.76 12/07/2019   AST 31 04/11/2020  ALT 64 (H) 04/11/2020   CALCIUM 10.2 12/07/2019   GFR 87.72 12/07/2019   Lab Results  Component Value Date   CHOL 205 (H) 12/07/2019   Lab Results  Component Value Date   HDL 42.40 12/07/2019   Lab Results  Component Value Date   LDLCALC 123 (H) 12/07/2019   Lab Results  Component Value Date   TRIG 195.0 (H) 12/07/2019   Lab Results  Component Value Date   CHOLHDL 5 12/07/2019   No results found for: HGBA1C    Assessment & Plan:   Problem List Items Addressed This Visit       Other   Medication monitoring encounter   Relevant Orders   Lithium level   Other fatigue   Relevant Orders   CBC   Comprehensive metabolic panel   Vitamin A15   Hemoglobin A1c   HIV Antibody (routine testing w rflx)   TSH   hCG, serum, qualitative   Bipolar affective, mixed ([place]) - Primary   Other Visit Diagnoses     Healthcare maintenance       Relevant Orders   CBC   Comprehensive metabolic panel   HIV Antibody (routine testing w rflx)   LDL cholesterol, direct   Urinalysis, Routine w reflex microscopic       No orders of the defined types were placed in this encounter.   Follow-up: Return in about 3 months (around 07/17/2021), or if symptoms worsen or fail to improve.  Labs are pending.  Encouraged exercise and social outlets.  Discussed the possibility of increasing the dose of sertraline.  Libby Maw, MD

## 2021-04-17 LAB — COMPREHENSIVE METABOLIC PANEL
ALT: 48 U/L — ABNORMAL HIGH (ref 0–35)
AST: 22 U/L (ref 0–37)
Albumin: 4.4 g/dL (ref 3.5–5.2)
Alkaline Phosphatase: 68 U/L (ref 39–117)
BUN: 16 mg/dL (ref 6–23)
CO2: 28 mEq/L (ref 19–32)
Calcium: 10.2 mg/dL (ref 8.4–10.5)
Chloride: 104 mEq/L (ref 96–112)
Creatinine, Ser: 0.8 mg/dL (ref 0.40–1.20)
GFR: 96.27 mL/min (ref >60.00)
Glucose, Bld: 94 mg/dL (ref 70–99)
Potassium: 4 mEq/L (ref 3.5–5.1)
Sodium: 138 mEq/L (ref 135–145)
Total Bilirubin: 0.5 mg/dL (ref 0.2–1.2)
Total Protein: 6.7 g/dL (ref 6.0–8.3)

## 2021-04-17 LAB — URINALYSIS, ROUTINE W REFLEX MICROSCOPIC
Bilirubin Urine: NEGATIVE
Hgb urine dipstick: NEGATIVE
Ketones, ur: NEGATIVE
Leukocytes,Ua: NEGATIVE
Nitrite: NEGATIVE
Specific Gravity, Urine: 1.025 (ref 1.000–1.030)
Total Protein, Urine: NEGATIVE
Urine Glucose: NEGATIVE
Urobilinogen, UA: 0.2 (ref 0.0–1.0)
pH: 5.5 (ref 5.0–8.0)

## 2021-04-17 LAB — CBC
HCT: 38.7 % (ref 36.0–46.0)
Hemoglobin: 12.6 g/dL (ref 12.0–15.0)
MCHC: 32.5 g/dL (ref 30.0–36.0)
MCV: 86.9 fl (ref 78.0–100.0)
Platelets: 259 10*3/uL (ref 150.0–400.0)
RBC: 4.45 Mil/uL (ref 3.87–5.11)
RDW: 12.9 % (ref 11.5–15.5)
WBC: 6.8 10*3/uL (ref 4.0–10.5)

## 2021-04-17 LAB — HEMOGLOBIN A1C: Hgb A1c MFr Bld: 5.1 % (ref 4.6–6.5)

## 2021-04-17 LAB — LDL CHOLESTEROL, DIRECT: Direct LDL: 116 mg/dL

## 2021-04-17 LAB — VITAMIN B12: Vitamin B-12: 230 pg/mL (ref 211–911)

## 2021-04-17 LAB — TSH: TSH: 1.71 u[IU]/mL (ref 0.35–5.50)

## 2021-04-18 LAB — HIV ANTIBODY (ROUTINE TESTING W REFLEX): HIV 1&2 Ab, 4th Generation: NONREACTIVE

## 2021-04-18 LAB — LITHIUM LEVEL: Lithium Lvl: 0.6 mmol/L (ref 0.6–1.2)

## 2021-05-02 IMAGING — MR MR LUMBAR SPINE W/O CM
4 of 5 series · 26 of 48 positions shown · non-contrast
Comparison: None.

CLINICAL DATA: Right radicular pain. Symptoms for 15 years,
worsening

EXAM:
MRI LUMBAR SPINE WITHOUT CONTRAST
TECHNIQUE: Multiplanar, multisequence MR imaging of the lumbar spine was
performed. No intravenous contrast was administered.

[Series 2: T1 · sagittal · 4.0mm · 0.81mm/px · 6 of 16 slices shown (1 of 2)]
[im 1/16]
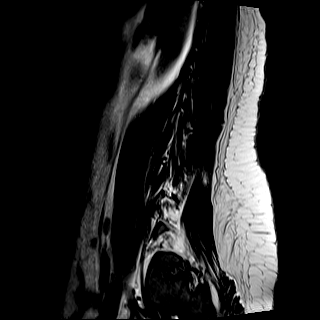
[im 4/16]
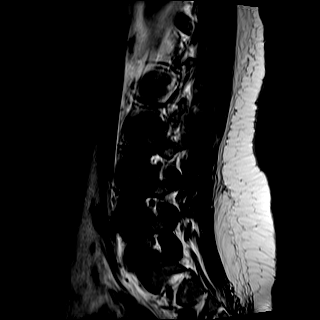
[im 7/16]
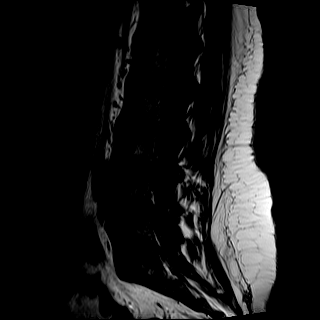
[im 10/16]
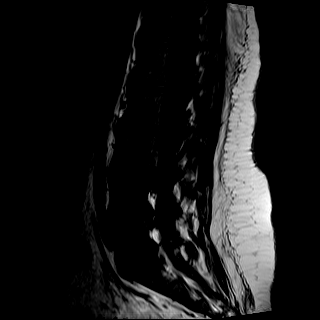
[im 13/16]
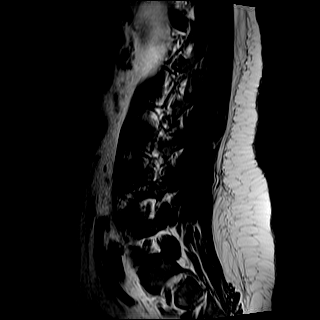
[im 16/16]
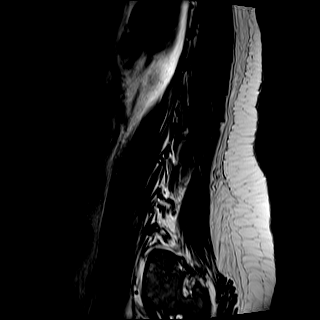

[Series 3: T2 · sagittal · 4.0mm · 0.81mm/px · 6 of 16 slices shown (1 of 2)]
[im 1/16]
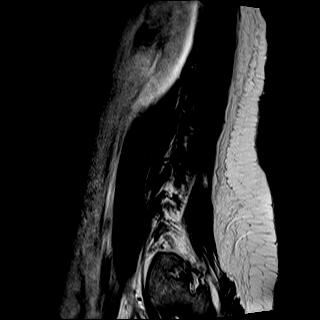
[im 4/16]
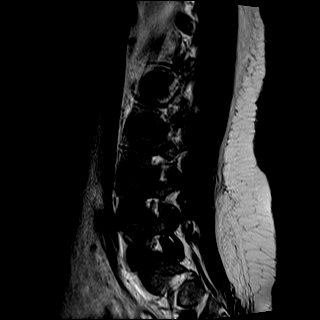
[im 7/16]
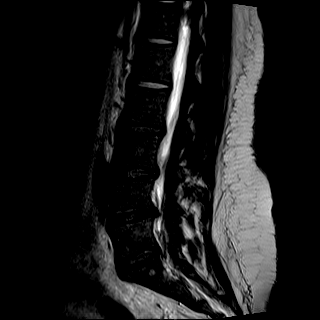
[im 10/16]
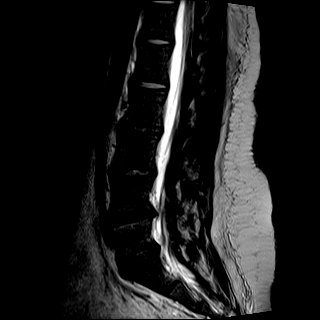
[im 13/16]
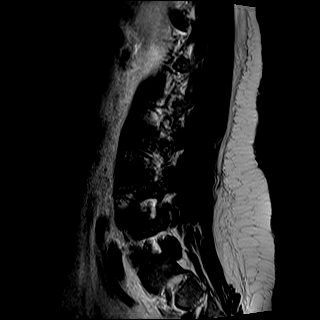
[im 16/16]
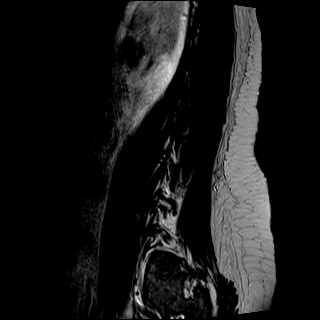

[Series 5: T2 · axial · 4.0mm · 0.39mm/px · z∈[-61,+124]mm · 9 of 38 slices shown (2 of 2)]
[im 1/38]
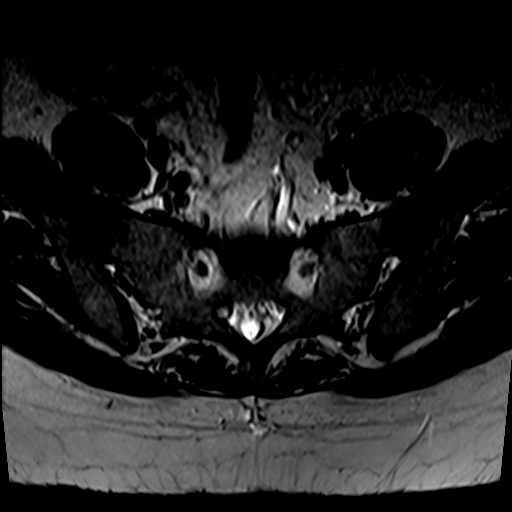
[im 6/38]
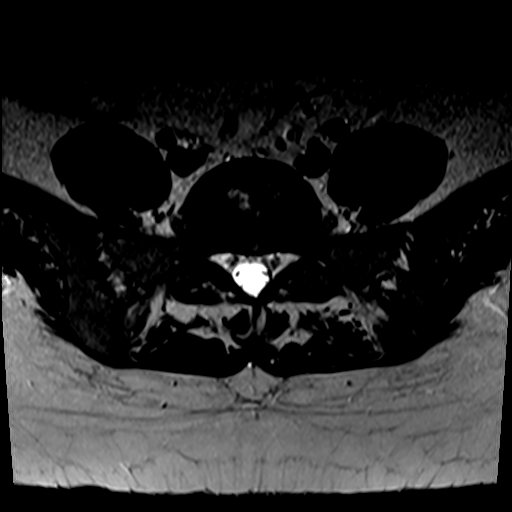
[im 11/38]
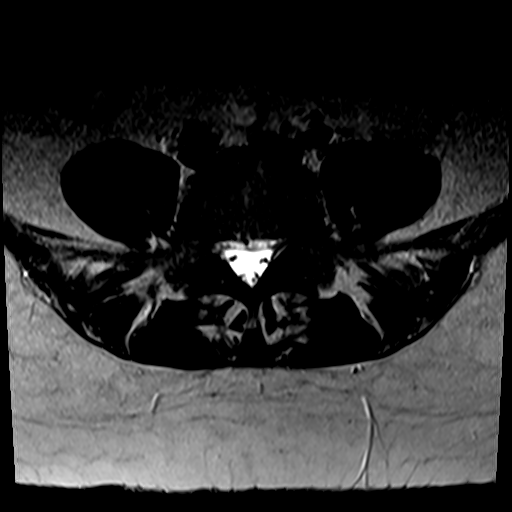
[im 16/38]
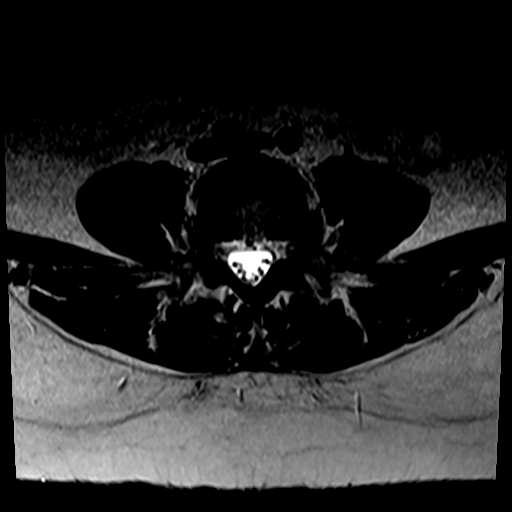
[im 19/38]
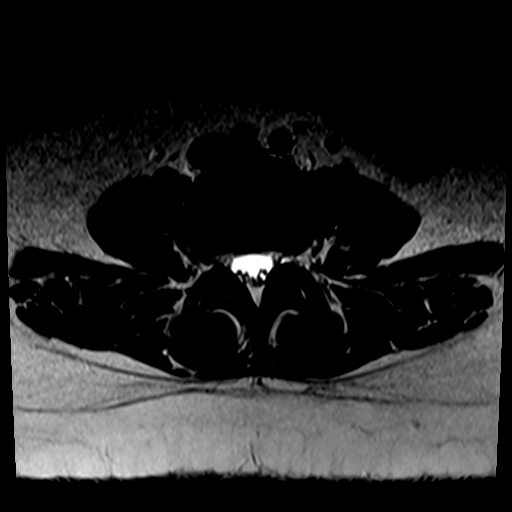
[im 22/38]
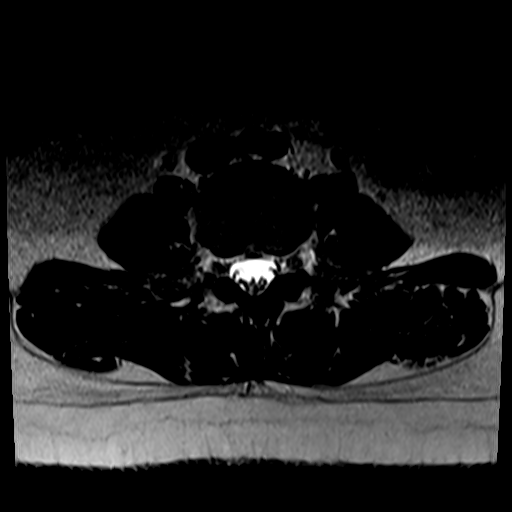
[im 27/38]
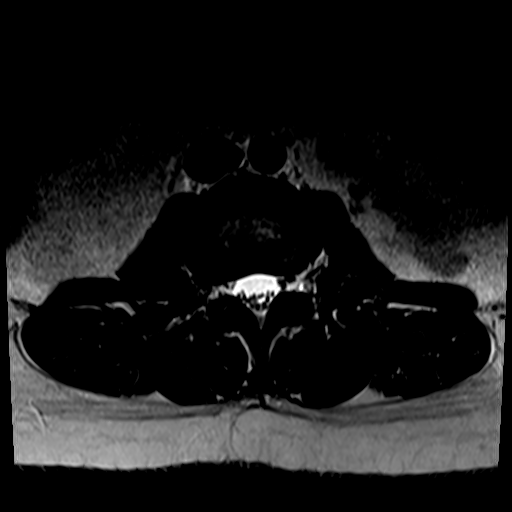
[im 32/38]
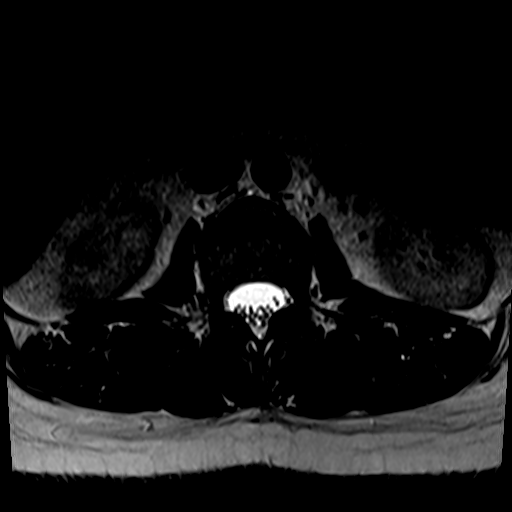
[im 38/38]
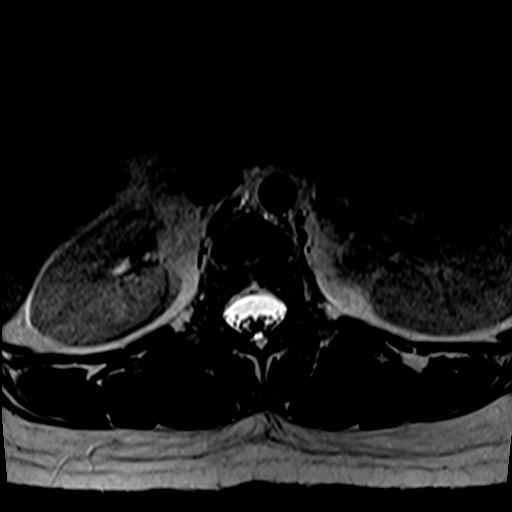

[Series 6: T1 · axial · 4.0mm · 0.39mm/px · z∈[-61,+94]mm · 5 of 38 slices shown (2 of 2)]
[im 1/38]
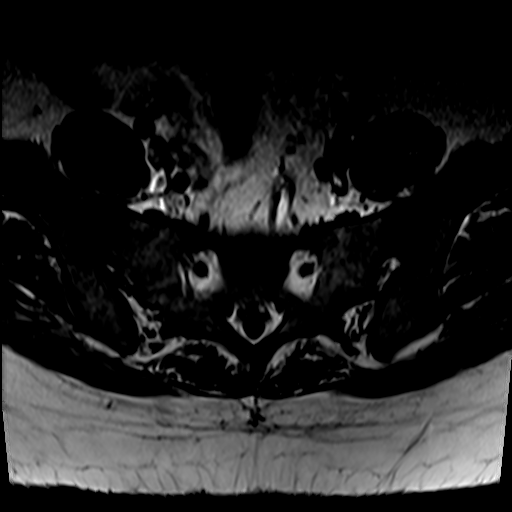
[im 6/38]
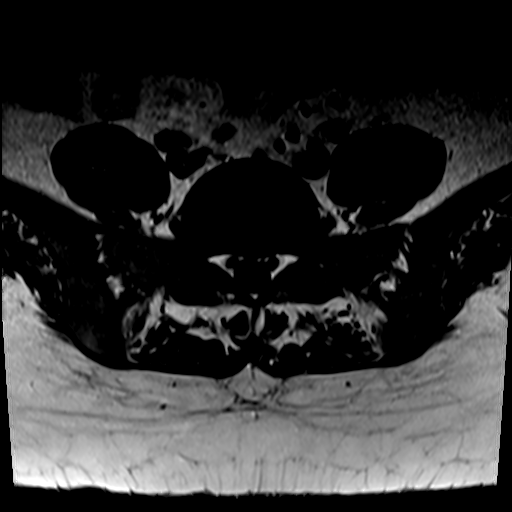
[im 11/38]
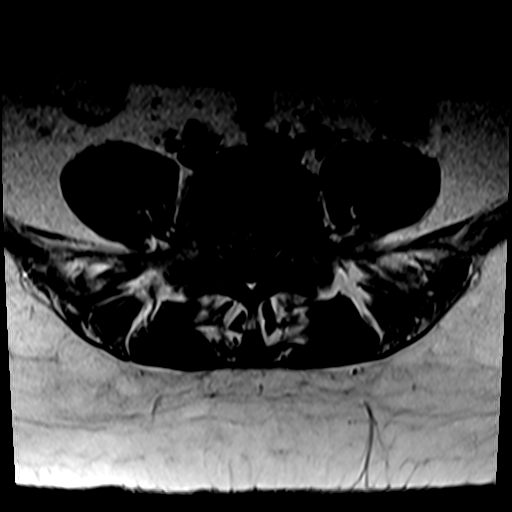
[im 19/38]
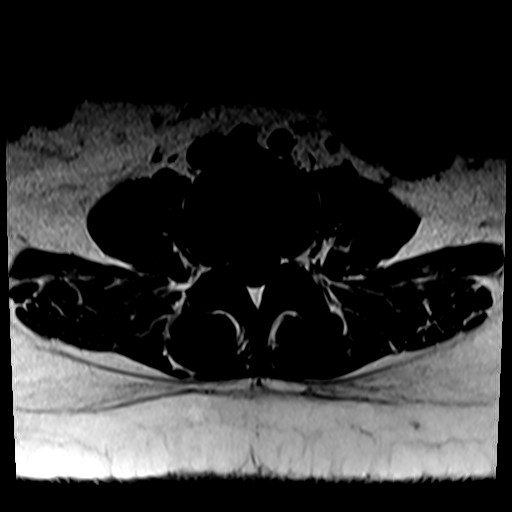
[im 32/38]
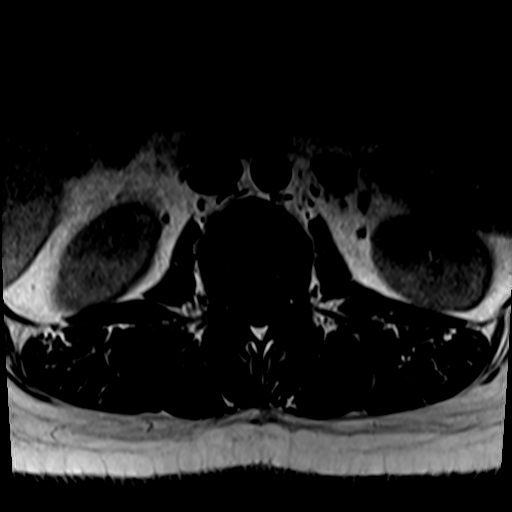

[26 of 48 positions shown; findings below may reference images not displayed]

FINDINGS: Segmentation:  Standard lumbar numbering

Alignment:  Physiologic.

Vertebrae:  No fracture, evidence of discitis, or bone lesion.

Conus medullaris and cauda equina: Conus extends to the L1-2 level.
Conus and cauda equina appear normal.

Paraspinal and other soft tissues: Negative

Disc levels:

T12- L1: Unremarkable.

L1-L2: Unremarkable.

L2-L3: Minimal rightward disc bulging

L3-L4: Rightward disc bulging with annular fissure and foraminal
protrusion that may contact but does not deflect the L3 nerve root,
see sagittal T2 weighted imaging with annotation.

L4-L5: Disc narrowing with slight discogenic edema. Central disc
protrusion that is broad and contacts but does not compress the L5
nerve roots.

L5-S1:Small left paracentral annular fissure and protrusion,
noncompressive.
IMPRESSION: 1. Multilevel mild disc degeneration with annular fissures and small
protrusions.
2. L4-5 shallow central protrusion that contacts but does not
compress the L5 nerve roots.
3. L3-4 right foraminal protrusion which may contact but does not
displace the L3 nerve root.

## 2021-06-19 ENCOUNTER — Other Ambulatory Visit: Payer: Self-pay

## 2021-06-19 DIAGNOSIS — F419 Anxiety disorder, unspecified: Secondary | ICD-10-CM

## 2021-06-19 DIAGNOSIS — F3160 Bipolar disorder, current episode mixed, unspecified: Secondary | ICD-10-CM

## 2021-06-19 MED ORDER — LITHIUM CARBONATE ER 300 MG PO TBCR: 600.0000 mg | EXTENDED_RELEASE_TABLET | Freq: Two times a day (BID) | ORAL | 1 refills | Status: DC

## 2021-06-19 MED ORDER — SERTRALINE HCL 50 MG PO TABS: 75.0000 mg | ORAL_TABLET | Freq: Every day | ORAL | 2 refills | Status: DC

## 2021-06-19 NOTE — Telephone Encounter (Signed)
Refill request for:  Sertraline 50 mg LR 09/06/20,#135 2 rf (Dr Bryan Lemma)  Lithium Carbonate ER 300 mg LR 11/29/20, #360, 1 rf (Dr Bryan Lemma) [facility] 04/16/21 F[facility]  none scheduled.   Please review and advise.  Thanks. Dm/cma

## 2021-07-19 ENCOUNTER — Encounter: Payer: Self-pay | Admitting: Family Medicine

## 2021-07-19 ENCOUNTER — Telehealth (INDEPENDENT_AMBULATORY_CARE_PROVIDER_SITE_OTHER): Payer: BC Managed Care – PPO | Admitting: Family Medicine

## 2021-07-19 VITALS — Ht 71.0 in | Wt 250.0 lb

## 2021-07-19 DIAGNOSIS — F4310 Post-traumatic stress disorder, unspecified: Secondary | ICD-10-CM | POA: Insufficient documentation

## 2021-07-19 DIAGNOSIS — J040 Acute laryngitis: Secondary | ICD-10-CM

## 2021-07-19 DIAGNOSIS — H6983 Other specified disorders of Eustachian tube, bilateral: Secondary | ICD-10-CM | POA: Diagnosis not present

## 2021-07-19 DIAGNOSIS — J069 Acute upper respiratory infection, unspecified: Secondary | ICD-10-CM

## 2021-07-19 DIAGNOSIS — H6993 Unspecified Eustachian tube disorder, bilateral: Secondary | ICD-10-CM

## 2021-07-19 DIAGNOSIS — G4700 Insomnia, unspecified: Secondary | ICD-10-CM | POA: Insufficient documentation

## 2021-07-19 MED ORDER — PSEUDOEPHEDRINE-GUAIFENESIN ER 60-600 MG PO TB12: 1.0000 | ORAL_TABLET | Freq: Two times a day (BID) | ORAL | 0 refills | Status: DC

## 2021-07-19 MED ORDER — PREDNISONE 10 MG PO TABS: 10.0000 mg | ORAL_TABLET | Freq: Two times a day (BID) | ORAL | 0 refills | Status: AC

## 2021-07-19 NOTE — Progress Notes (Signed)
Established Patient Office Visit  Subjective:  Patient ID: Alexis Bautista, female    DOB: 1986-01-26  Age: 36 y.o. MRN: [id number]  CC:  Chief Complaint  Patient presents with   Cough    Cough, pressure in face behind ears and jaw, hoarse voice symptoms x 3 days.    HPI Alexis Bautista presents for evaluation and treatment of a 3-day history of nasal congestion, ear congestion, sinus pressure in her cheekbones, cough.  She denies fever chills, wheezing or asthma history, myalgias or arthralgias.  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  She does not smoke.  No past medical history on file.  No past surgical history on file.  Family History  Problem Relation Age of Onset   Cancer Mother    Hypertension Father     Social History   Socioeconomic History   Marital status: Single    Spouse name: Not on file   Number of children: Not on file   Years of education: Not on file   Highest education level: Not on file  Occupational History   Not on file  Tobacco Use   Smoking status: Never   Smokeless tobacco: Never  Vaping Use   Vaping Use: Never used  Substance and Sexual Activity   Alcohol use: Yes   Drug use: Never   Sexual activity: Not on file  Other Topics Concern   Not on file  Social History Narrative   Not on file   Social Determinants of Health   Financial Resource Strain: Not on file  Food Insecurity: Not on file  Transportation Needs: Not on file  Physical Activity: Not on file  Stress: Not on file  Social Connections: Not on file  Intimate Partner Violence: Not on file    Outpatient Medications Prior to Visit  Medication Sig Dispense Refill   BLISOVI 24 FE 1-20 MG-MCG(24) tablet Take 1 tablet by mouth daily.     lithium carbonate (LITHOBID) 300 MG CR tablet Take 2 tablets (600 mg total) by mouth 2 (two) times daily. 360 tablet 1   ranitidine (ZANTAC) 75 MG tablet Take 75 mg by mouth 2 (two) times daily.     sertraline (ZOLOFT) 50 MG tablet Take 1.5  tablets (75 mg total) by mouth daily. 135 tablet 2   traZODone (DESYREL) 50 MG tablet TAKE 1 TO 2 TABLETS BY MOUTH AT BEDTIME 180 tablet 1   famotidine (PEPCID) 20 MG tablet Take 1 tablet (20 mg total) by mouth 2 (two) times daily. 30 tablet 0   cetirizine (ZYRTEC) 10 MG tablet Take 1 tablet (10 mg total) by mouth daily. (Patient not taking: Reported on 04/16/2021) 5 tablet 0   No facility-administered medications prior to visit.    Allergies  Allergen Reactions   Amoxil [Amoxicillin] Hives   Coconut Fatty Acids Anaphylaxis and Itching   Justicia Adhatoda (Malabar Nut Tree) [Justicia Adhatoda] Anaphylaxis    Swelling of throat and tonuge   Pineapple Anaphylaxis and Itching   Shellfish-Derived Products Rash, Anaphylaxis and Hives   Coconut Oil    Peanut Oil    Shellfish Allergy    Fish Allergy Swelling and Rash    ROS Review of Systems  Constitutional:  Negative for diaphoresis, fatigue, fever and unexpected weight change.  HENT:  Positive for congestion, hearing loss, sinus pressure and voice change. Negative for postnasal drip, rhinorrhea and sore throat.   Eyes:  Negative for photophobia and visual disturbance.  Respiratory:  Positive for cough. Negative  for shortness of breath and wheezing.   Gastrointestinal: Negative.   Neurological:  Negative for weakness, light-headedness and headaches.     Objective:    Physical Exam Vitals and nursing note reviewed.  Constitutional:      General: She is not in acute distress.    Appearance: Normal appearance. She is not ill-appearing, toxic-appearing or diaphoretic.  HENT:     Head: Normocephalic and atraumatic.     Right Ear: External ear normal.     Left Ear: External ear normal.     Mouth/Throat:     Pharynx: Oropharynx is clear. No oropharyngeal exudate or posterior oropharyngeal erythema.  Eyes:     General: No scleral icterus.       Right eye: No discharge.        Left eye: No discharge.     Extraocular Movements:  Extraocular movements intact.     Conjunctiva/sclera: Conjunctivae normal.  Pulmonary:     Effort: Pulmonary effort is normal.  Neurological:     Mental Status: She is alert and oriented to person, place, and time.  Psychiatric:        Mood and Affect: Mood normal.        Behavior: Behavior normal.    Ht 5\' 11"  (1.803 m)    Wt 250 lb (113.4 kg)    BMI 34.87 kg/m  Wt Readings from Last 3 Encounters:  07/19/21 250 lb (113.4 kg)  04/16/21 253 lb 12.8 oz (115.1 kg)  02/09/21 250 lb (113.4 kg)     Health Maintenance Due  Topic Date Due   Hepatitis C Screening  Never done    There are no preventive care reminders to display for this patient.  Lab Results  Component Value Date   TSH 1.71 04/16/2021   Lab Results  Component Value Date   WBC 6.8 04/16/2021   HGB 12.6 04/16/2021   HCT 38.7 04/16/2021   MCV 86.9 04/16/2021   PLT 259.0 04/16/2021   Lab Results  Component Value Date   NA 138 04/16/2021   K 4.0 04/16/2021   CO2 28 04/16/2021   GLUCOSE 94 04/16/2021   BUN 16 04/16/2021   CREATININE 0.80 04/16/2021   BILITOT 0.5 04/16/2021   ALKPHOS 68 04/16/2021   AST 22 04/16/2021   ALT 48 (H) 04/16/2021   PROT 6.7 04/16/2021   ALBUMIN 4.4 04/16/2021   CALCIUM 10.2 04/16/2021   GFR 96.27 04/16/2021   Lab Results  Component Value Date   CHOL 205 (H) 12/07/2019   Lab Results  Component Value Date   HDL 42.40 12/07/2019   Lab Results  Component Value Date   LDLCALC 123 (H) 12/07/2019   Lab Results  Component Value Date   TRIG 195.0 (H) 12/07/2019   Lab Results  Component Value Date   CHOLHDL 5 12/07/2019   Lab Results  Component Value Date   HGBA1C 5.1 04/16/2021      Assessment & Plan:   Problem List Items Addressed This Visit       Respiratory   Laryngitis, acute - Primary   Relevant Medications   pseudoephedrine-guaifenesin (MUCINEX D) 60-600 MG 12 hr tablet   predniSONE (DELTASONE) 10 MG tablet   Viral upper respiratory tract infection    Relevant Medications   pseudoephedrine-guaifenesin (MUCINEX D) 60-600 MG 12 hr tablet   Other Relevant Orders   Novel Coronavirus, NAA ([facility])     Nervous and Auditory   Dysfunction of both eustachian tubes   Relevant Medications  pseudoephedrine-guaifenesin (MUCINEX D) 60-600 MG 12 hr tablet   predniSONE (DELTASONE) 10 MG tablet    Meds ordered this encounter  Medications   pseudoephedrine-guaifenesin (MUCINEX D) 60-600 MG 12 hr tablet    Sig: Take 1 tablet by mouth every 12 (twelve) hours. As needed for congestion in nose and/or ears    Dispense:  20 tablet    Refill:  0   predniSONE (DELTASONE) 10 MG tablet    Sig: Take 1 tablet (10 mg total) by mouth 2 (two) times daily with a meal for 5 days.    Dispense:  10 tablet    Refill:  0    Follow-up: Return in about 1 week (around 07/26/2021), or if symptoms worsen or fail to improve.  We will come by for a PCR COVID test.  Doubt COVID but in checking.  Will use decongestant for nasal congestion and ear congestion and to help relieve sinus pressure.  Low-dose prednisone for congestion and laryngitis.  Asked her to rest her voice is much as possible.  Let me know if not improving by mid next week.  Libby Maw, MD  Virtual Visit via Video Note  I connected with Alexis Bautista on 07/19/21 at  2:40 PM EST by a video enabled telemedicine application and verified that I am speaking with the correct person using two identifiers.  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  Provider: work   I discussed the limitations of evaluation and management by telemedicine and the availability of in person appointments. The patient expressed understanding and agreed to proceed.  History of Present Illness:    Observations/Objective:   Assessment and Plan:   Follow Up Instructions:    I discussed the assessment and treatment plan with the patient. The patient was provided an opportunity to ask questions and all  were answered. The patient agreed with the plan and demonstrated an understanding of the instructions.   The patient was advised to call back or seek an in-person evaluation if the symptoms worsen or if the condition fails to improve as anticipated.  I provided 20 minutes of non-face-to-face time during this encounter.   Libby Maw, MD

## 2021-07-19 NOTE — Addendum Note (Signed)
Addended by: Lynnea Ferrier on: 07/19/2021 03:53 PM ? ? Modules accepted: Orders ? ?

## 2021-07-20 LAB — NOVEL CORONAVIRUS, NAA: SARS-CoV-2, NAA: NOT DETECTED

## 2021-11-09 ENCOUNTER — Other Ambulatory Visit: Payer: Self-pay | Admitting: Family Medicine

## 2021-11-09 DIAGNOSIS — F3160 Bipolar disorder, current episode mixed, unspecified: Secondary | ICD-10-CM

## 2021-12-31 ENCOUNTER — Telehealth: Payer: Self-pay | Admitting: Family Medicine

## 2021-12-31 NOTE — Telephone Encounter (Signed)
Caller Name: Adaira Centola Call back phone #: [phone]  MEDICATION(S): Lithium carbonate   Preferred Pharmacy: [id number] [place] ~~~Please advise patient/caregiver to allow 2-3 business days to process RX refills.

## 2022-01-01 NOTE — Telephone Encounter (Signed)
Patient has refills at pharmacy will pick up today

## 2022-01-24 ENCOUNTER — Ambulatory Visit: Payer: BC Managed Care – PPO | Admitting: Family Medicine

## 2022-01-24 ENCOUNTER — Encounter: Payer: Self-pay | Admitting: Family Medicine

## 2022-01-24 VITALS — BP 112/82 | HR 86 | Temp 97.7°F | Ht 71.0 in | Wt 258.4 lb

## 2022-01-24 DIAGNOSIS — F419 Anxiety disorder, unspecified: Secondary | ICD-10-CM

## 2022-01-24 DIAGNOSIS — E538 Deficiency of other specified B group vitamins: Secondary | ICD-10-CM

## 2022-01-24 DIAGNOSIS — Z5181 Encounter for therapeutic drug level monitoring: Secondary | ICD-10-CM

## 2022-01-24 DIAGNOSIS — Z1159 Encounter for screening for other viral diseases: Secondary | ICD-10-CM

## 2022-01-24 DIAGNOSIS — Z0000 Encounter for general adult medical examination without abnormal findings: Secondary | ICD-10-CM | POA: Diagnosis not present

## 2022-01-24 DIAGNOSIS — R7401 Elevation of levels of liver transaminase levels: Secondary | ICD-10-CM | POA: Diagnosis not present

## 2022-01-24 DIAGNOSIS — F3160 Bipolar disorder, current episode mixed, unspecified: Secondary | ICD-10-CM

## 2022-01-24 MED ORDER — TRAZODONE HCL 50 MG PO TABS: 50.0000 mg | ORAL_TABLET | Freq: Every day | ORAL | 1 refills | Status: DC

## 2022-01-24 MED ORDER — SERTRALINE HCL 50 MG PO TABS: 75.0000 mg | ORAL_TABLET | Freq: Every day | ORAL | 2 refills | Status: DC

## 2022-01-24 NOTE — Progress Notes (Signed)
Established Patient Office Visit  Subjective   Patient ID: Alexis Bautista, female    DOB: February 16, 1986  Age: 36 y.o. MRN: [id number]  Chief Complaint  Patient presents with   Follow-up    Follow up on medications, no concerns.     HPI for yearly health check follow-up of bipolar mixed affective disorder that is responding well to treatment with sertraline Lithobid and trazodone.  Continues with counseling.  Continues working as a [employer].  She is exercising regularly going to the gym.  Ongoing follow-up with mental health counseling.  She is working to expand her social network.  She has regular dental care.  Recent GYN care.  Parents live in [place].  Sister and brother-in-law live in [place].     Review of Systems  Constitutional: Negative.   HENT: Negative.    Eyes:  Negative for blurred vision, discharge and redness.  Respiratory: Negative.    Cardiovascular: Negative.   Gastrointestinal:  Negative for abdominal pain.  Genitourinary: Negative.   Musculoskeletal: Negative.  Negative for myalgias.  Skin:  Negative for rash.  Neurological:  Negative for tingling, loss of consciousness and weakness.  Endo/Heme/Allergies:  Negative for polydipsia.      01/24/2022    4:04 PM 07/19/2021    1:51 PM 04/16/2021    4:24 PM  Depression screen PHQ 2/9  Decreased Interest 0 0 1  Down, Depressed, Hopeless 0 0 2  PHQ - 2 Score 0 0 3        Objective:     BP 112/82 (BP Location: Left Arm, Patient Position: Sitting, Cuff Size: Large)   Pulse 86   Temp 97.7 F (36.5 C) (Temporal)   Ht '5\' 11"'$  (1.803 m)   Wt 258 lb 6.4 oz (117.2 kg)   SpO2 97%   BMI 36.04 kg/m  Wt Readings from Last 3 Encounters:  01/24/22 258 lb 6.4 oz (117.2 kg)  07/19/21 250 lb (113.4 kg)  04/16/21 253 lb 12.8 oz (115.1 kg)      Physical Exam Constitutional:      General: She is not in acute distress.    Appearance: Normal appearance. She is not ill-appearing, toxic-appearing or diaphoretic.   HENT:     Head: Normocephalic and atraumatic.     Right Ear: External ear normal.     Left Ear: External ear normal.     Mouth/Throat:     Mouth: Mucous membranes are moist.     Pharynx: Oropharynx is clear. No oropharyngeal exudate or posterior oropharyngeal erythema.  Eyes:     General: No scleral icterus.       Right eye: No discharge.        Left eye: No discharge.     Extraocular Movements: Extraocular movements intact.     Conjunctiva/sclera: Conjunctivae normal.     Pupils: Pupils are equal, round, and reactive to light.  Cardiovascular:     Rate and Rhythm: Normal rate and regular rhythm.  Pulmonary:     Effort: Pulmonary effort is normal. No respiratory distress.     Breath sounds: Normal breath sounds.  Abdominal:     General: Bowel sounds are normal.  Musculoskeletal:     Cervical back: No rigidity or tenderness.     Right lower leg: No edema.     Left lower leg: No edema.  Skin:    General: Skin is warm and dry.  Neurological:     Mental Status: She is alert and oriented to person, place,  and time.  Psychiatric:        Mood and Affect: Mood normal.        Behavior: Behavior normal.      Results for orders placed or performed in visit on 01/24/22  Vitamin B12  Result Value Ref Range   Vitamin B-12 247 211 - 911 pg/mL  Basic metabolic panel  Result Value Ref Range   Sodium 136 135 - 145 mEq/L   Potassium 3.8 3.5 - 5.1 mEq/L   Chloride 102 96 - 112 mEq/L   CO2 27 19 - 32 mEq/L   Glucose, Bld 70 70 - 99 mg/dL   BUN 16 6 - 23 mg/dL   Creatinine, Ser 0.78 0.40 - 1.20 mg/dL   GFR 98.70 >60.00 mL/min   Calcium 10.4 8.4 - 10.5 mg/dL  CBC  Result Value Ref Range   WBC 7.3 4.0 - 10.5 K/uL   RBC 4.58 3.87 - 5.11 Mil/uL   Platelets 265.0 150.0 - 400.0 K/uL   Hemoglobin 13.3 12.0 - 15.0 g/dL   HCT 39.3 36.0 - 46.0 %   MCV 85.9 78.0 - 100.0 fl   MCHC 33.8 30.0 - 36.0 g/dL   RDW 12.9 11.5 - 15.5 %  Hepatic function panel  Result Value Ref Range   Total  Bilirubin 0.4 0.2 - 1.2 mg/dL   Bilirubin, Direct 0.1 0.0 - 0.3 mg/dL   Alkaline Phosphatase 66 39 - 117 U/L   AST 26 0 - 37 U/L   ALT 48 (H) 0 - 35 U/L   Total Protein 7.1 6.0 - 8.3 g/dL   Albumin 4.2 3.5 - 5.2 g/dL  Lithium level  Result Value Ref Range   Lithium Lvl 0.6 0.6 - 1.2 mmol/L  Urinalysis, Routine w reflex microscopic  Result Value Ref Range   Color, Urine YELLOW Yellow;Lt. Yellow;Straw;Dark Yellow;Amber;Green;Red;Brown   APPearance Sl Cloudy (A) Clear;Turbid;Slightly Cloudy;Cloudy   Specific Gravity, Urine 1.010 1.000 - 1.030   pH 6.0 5.0 - 8.0   Total Protein, Urine NEGATIVE Negative   Urine Glucose NEGATIVE Negative   Ketones, ur NEGATIVE Negative   Bilirubin Urine NEGATIVE Negative   Hgb urine dipstick NEGATIVE Negative   Urobilinogen, UA 0.2 0.0 - 1.0   Leukocytes,Ua NEGATIVE Negative   Nitrite NEGATIVE Negative   WBC, UA 0-2/hpf 0-2/hpf   RBC / HPF none seen 0-2/hpf   Mucus, UA Presence of (A) None   Squamous Epithelial / LPF Few(5-10/hpf) (A) Rare(0-4/hpf)   Bacteria, UA Rare(<10/hpf) (A) None   Amorphous Present (A) None;Present      The ASCVD Risk score (Arnett DK, et al., 2019) failed to calculate for the following reasons:   The 2019 ASCVD risk score is only valid for ages 26 to 86    Assessment & Plan:   Problem List Items Addressed This Visit       Other   Healthcare maintenance   Relevant Orders   Basic metabolic panel (Completed)   CBC (Completed)   Hepatic function panel (Completed)   Urinalysis, Routine w reflex microscopic (Completed)   B12 deficiency   Relevant Orders   Vitamin B12 (Completed)   Bipolar affective, mixed ([place]) - Primary   Relevant Orders   Lithium level (Completed)   Elevated ALT measurement   Relevant Orders   Hepatitis C Antibody   US Abdomen Complete   Other Visit Diagnoses     Morbid obesity ([place])       Relevant Orders   Amb ref to Medical Nutrition  Therapy-MNT   Encounter for hepatitis C screening  test for low risk patient       Relevant Orders   Hepatitis C Antibody   Anxiety       Relevant Medications   sertraline (ZOLOFT) 50 MG tablet   traZODone (DESYREL) 50 MG tablet       Return in about 6 months (around 07/25/2022).  We will consider abdominal ultrasound with persisting elevation of ALT.  Discussed with patient.  Likely secondary to weight.  She will continue exercising and agrees to go for nutritional consultation.  Declines flu vaccine today.  Continue regular follow-up with counseling.  Libby Maw, MD

## 2022-01-25 LAB — HEPATIC FUNCTION PANEL
ALT: 48 U/L — ABNORMAL HIGH (ref 0–35)
AST: 26 U/L (ref 0–37)
Albumin: 4.2 g/dL (ref 3.5–5.2)
Alkaline Phosphatase: 66 U/L (ref 39–117)
Bilirubin, Direct: 0.1 mg/dL (ref 0.0–0.3)
Total Bilirubin: 0.4 mg/dL (ref 0.2–1.2)
Total Protein: 7.1 g/dL (ref 6.0–8.3)

## 2022-01-25 LAB — URINALYSIS, ROUTINE W REFLEX MICROSCOPIC
Bilirubin Urine: NEGATIVE
Hgb urine dipstick: NEGATIVE
Ketones, ur: NEGATIVE
Leukocytes,Ua: NEGATIVE
Nitrite: NEGATIVE
RBC / HPF: NONE SEEN (ref 0–?)
Specific Gravity, Urine: 1.01 (ref 1.000–1.030)
Total Protein, Urine: NEGATIVE
Urine Glucose: NEGATIVE
Urobilinogen, UA: 0.2 (ref 0.0–1.0)
pH: 6 (ref 5.0–8.0)

## 2022-01-25 LAB — BASIC METABOLIC PANEL
BUN: 16 mg/dL (ref 6–23)
CO2: 27 mEq/L (ref 19–32)
Calcium: 10.4 mg/dL (ref 8.4–10.5)
Chloride: 102 mEq/L (ref 96–112)
Creatinine, Ser: 0.78 mg/dL (ref 0.40–1.20)
GFR: 98.7 mL/min (ref >60.00)
Glucose, Bld: 70 mg/dL (ref 70–99)
Potassium: 3.8 mEq/L (ref 3.5–5.1)
Sodium: 136 mEq/L (ref 135–145)

## 2022-01-25 LAB — CBC
HCT: 39.3 % (ref 36.0–46.0)
Hemoglobin: 13.3 g/dL (ref 12.0–15.0)
MCHC: 33.8 g/dL (ref 30.0–36.0)
MCV: 85.9 fl (ref 78.0–100.0)
Platelets: 265 10*3/uL (ref 150.0–400.0)
RBC: 4.58 Mil/uL (ref 3.87–5.11)
RDW: 12.9 % (ref 11.5–15.5)
WBC: 7.3 10*3/uL (ref 4.0–10.5)

## 2022-01-25 LAB — LITHIUM LEVEL: Lithium Lvl: 0.6 mmol/L (ref 0.6–1.2)

## 2022-01-25 LAB — VITAMIN B12: Vitamin B-12: 247 pg/mL (ref 211–911)

## 2022-01-28 NOTE — Addendum Note (Signed)
Addended by: Abelino Derrick A on: 01/28/2022 01:12 PM   Modules accepted: Orders

## 2022-02-21 ENCOUNTER — Telehealth (HOSPITAL_BASED_OUTPATIENT_CLINIC_OR_DEPARTMENT_OTHER): Payer: Self-pay

## 2022-03-04 ENCOUNTER — Ambulatory Visit: Payer: BC Managed Care – PPO | Admitting: Nurse Practitioner

## 2022-03-04 ENCOUNTER — Encounter: Payer: Self-pay | Admitting: Nurse Practitioner

## 2022-03-04 VITALS — BP 118/88 | HR 93 | Wt 254.6 lb

## 2022-03-04 DIAGNOSIS — R109 Unspecified abdominal pain: Secondary | ICD-10-CM | POA: Diagnosis not present

## 2022-03-04 DIAGNOSIS — R1012 Left upper quadrant pain: Secondary | ICD-10-CM

## 2022-03-04 DIAGNOSIS — R160 Hepatomegaly, not elsewhere classified: Secondary | ICD-10-CM

## 2022-03-04 LAB — POCT URINALYSIS DIPSTICK
Bilirubin, UA: NEGATIVE
Blood, UA: NEGATIVE
Glucose, UA: NEGATIVE
Ketones, UA: NEGATIVE
Leukocytes, UA: NEGATIVE
Nitrite, UA: NEGATIVE
Protein, UA: POSITIVE — AB
Spec Grav, UA: 1.01 (ref 1.010–1.025)
Urobilinogen, UA: NEGATIVE E.U./dL — AB
pH, UA: 6 (ref 5.0–8.0)

## 2022-03-04 LAB — POCT URINE PREGNANCY: Preg Test, Ur: NEGATIVE

## 2022-03-04 MED ORDER — TRAMADOL HCL 50 MG PO TABS: 50.0000 mg | ORAL_TABLET | Freq: Three times a day (TID) | ORAL | 0 refills | Status: AC | PRN

## 2022-03-04 MED ORDER — PANTOPRAZOLE SODIUM 40 MG PO TBEC: 40.0000 mg | DELAYED_RELEASE_TABLET | Freq: Every day | ORAL | 3 refills | Status: DC

## 2022-03-04 NOTE — Patient Instructions (Signed)
It was great to see you!  We are checking your labs today and will let you know the results via mychart/phone.   Start protonix once a day for your stomach and tramadol every 8 hours as needed for pain. Keep using heat/ice to the area.   I have ordered an urgent CT scan, they will call to schedule.   Let's follow-up in 1 week, sooner if you have concerns.  If a referral was placed today, you will be contacted for an appointment. Please note that routine referrals can sometimes take up to 3-4 weeks to process. Please call our office if you haven't heard anything after this time frame.  Take care,  Vance Peper, NP

## 2022-03-04 NOTE — Assessment & Plan Note (Addendum)
Left upper quadrant abdominal pain that radiates around to her back that started last night.  She has endorsed decreased appetite, nausea.  Left upper quadrant is tender on exam along with a positive CVA tenderness.  No fevers, vomiting.  UA is normal, urine pregnancy negative.  With CVA tenderness, concern for possible kidney stone, versus pancreatitis versus GI bleed.  We will check CMP, CBC, lipase and order a stat CT abdomen. Discussed case with Dr. Ethelene Hal. Discussed ER precautions.  We will have her start Protonix 40 mg daily and tramadol 50 mg every 8 hours as needed for pain.  PDMP reviewed.  Work note given.  Follow-up in 1 week, sooner if symptoms worsen.

## 2022-03-04 NOTE — Progress Notes (Signed)
Acute Office Visit  Subjective:     Patient ID: Alexis Bautista, female    DOB: 1986/05/06, 36 y.o.   MRN: [id number]  Chief Complaint  Patient presents with   Abdominal Pain    Pt c/o throbbing/sharp pain  in LT side of abd w/    HPI  Patient is in today for throbbing pain in her left upper quadrant that started last night. The pain radiates around her side to her back. She endorses nausea and being unable to eat.   ABDOMINAL PAIN   Duration:days Onset: sudden Severity: severe Quality: sharp, throbbing Location:  LUQ  Episode duration:  Radiation: yes - around side to back Frequency: constant Alleviating factors: heat Aggravating factors: movement, eating Status: worse Treatments attempted: none Fever: no Nausea: yes Vomiting: no Weight loss: no Decreased appetite: yes Diarrhea: no Constipation: no Blood in stool: no Heartburn: no Jaundice: no Rash: no Dysuria/urinary frequency:  no dysuria, yes frequency Hematuria: no History of sexually transmitted disease: no Recurrent NSAID use:  excedrin migraine at times  ROS See pertinent positives and negatives per HPI.     Objective:    BP 118/88   Pulse 93   Wt 254 lb 9.6 oz (115.5 kg)   SpO2 98%   BMI 35.51 kg/m    Physical Exam Vitals and nursing note reviewed.  Constitutional:      General: She is not in acute distress.    Appearance: Normal appearance.  HENT:     Head: Normocephalic.  Eyes:     Conjunctiva/sclera: Conjunctivae normal.  Cardiovascular:     Rate and Rhythm: Normal rate and regular rhythm.     Pulses: Normal pulses.     Heart sounds: Normal heart sounds.  Pulmonary:     Effort: Pulmonary effort is normal.     Breath sounds: Normal breath sounds.  Abdominal:     Palpations: Abdomen is soft.     Tenderness: There is abdominal tenderness in the left upper quadrant and left lower quadrant. There is no guarding. Negative signs include Murphy's sign, Rovsing's sign and McBurney's sign.   Musculoskeletal:     Cervical back: Normal range of motion.  Skin:    General: Skin is warm.  Neurological:     General: No focal deficit present.     Mental Status: She is alert and oriented to person, place, and time.  Psychiatric:        Mood and Affect: Mood normal.        Behavior: Behavior normal.        Thought Content: Thought content normal.        Judgment: Judgment normal.     Results for orders placed or performed in visit on 03/04/22  POCT urinalysis dipstick  Result Value Ref Range   Color, UA Yellow    Clarity, UA Cloudy    Glucose, UA Negative Negative   Bilirubin, UA Negative    Ketones, UA Negative    Spec Grav, UA 1.010 1.010 - 1.025   Blood, UA Negative    pH, UA 6.0 5.0 - 8.0   Protein, UA Positive (A) Negative   Urobilinogen, UA negative (A) 0.2 or 1.0 E.U./dL   Nitrite, UA negative    Leukocytes, UA Negative Negative   Appearance     Odor    POCT urine pregnancy  Result Value Ref Range   Preg Test, Ur Negative Negative        Assessment & Plan:   Problem List  Items Addressed This Visit       Other   Left upper quadrant abdominal pain - Primary    Left upper quadrant abdominal pain that radiates around to her back that started last night.  She has endorsed decreased appetite, nausea.  Left upper quadrant is tender on exam along with a positive CVA tenderness.  No fevers, vomiting.  UA is normal, urine pregnancy negative.  With CVA tenderness, concern for possible kidney stone, versus pancreatitis versus GI bleed.  We will check CMP, CBC, lipase and order a stat CT abdomen. Discussed case with Dr. Ethelene Hal. Discussed ER precautions.  We will have her start Protonix 40 mg daily and tramadol 50 mg every 8 hours as needed for pain.  PDMP reviewed.  Work note given.  Follow-up in 1 week, sooner if symptoms worsen.      Relevant Orders   CBC with Differential/Platelet   Comprehensive metabolic panel   Lipase   POCT urinalysis dipstick (Completed)    POCT urine pregnancy (Completed)   CT ABDOMEN PELVIS W WO CONTRAST    Meds ordered this encounter  Medications   pantoprazole (PROTONIX) 40 MG tablet    Sig: Take 1 tablet (40 mg total) by mouth daily.    Dispense:  30 tablet    Refill:  3   traMADol (ULTRAM) 50 MG tablet    Sig: Take 1 tablet (50 mg total) by mouth every 8 (eight) hours as needed for up to 5 days.    Dispense:  15 tablet    Refill:  0    Return in about 1 week (around 03/11/2022) for LUQ pain.  Charyl Dancer, NP

## 2022-03-05 ENCOUNTER — Ambulatory Visit (HOSPITAL_BASED_OUTPATIENT_CLINIC_OR_DEPARTMENT_OTHER)
Admission: RE | Admit: 2022-03-05 | Discharge: 2022-03-05 | Disposition: A | Discharge status: Home / Self Care | Payer: BC Managed Care – PPO | Source: Ambulatory Visit | Attending: Nurse Practitioner | Admitting: Nurse Practitioner

## 2022-03-05 DIAGNOSIS — R109 Unspecified abdominal pain: Secondary | ICD-10-CM | POA: Insufficient documentation

## 2022-03-05 DIAGNOSIS — R1012 Left upper quadrant pain: Secondary | ICD-10-CM | POA: Insufficient documentation

## 2022-03-05 LAB — CBC WITH DIFFERENTIAL/PLATELET
Basophils Absolute: 0.1 10*3/uL (ref 0.0–0.1)
Basophils Relative: 1.1 % (ref 0.0–3.0)
Eosinophils Absolute: 0.2 10*3/uL (ref 0.0–0.7)
Eosinophils Relative: 2.1 % (ref 0.0–5.0)
HCT: 39.9 % (ref 36.0–46.0)
Hemoglobin: 13.4 g/dL (ref 12.0–15.0)
Lymphocytes Relative: 26.3 % (ref 12.0–46.0)
Lymphs Abs: 2.6 10*3/uL (ref 0.7–4.0)
MCHC: 33.7 g/dL (ref 30.0–36.0)
MCV: 86.1 fl (ref 78.0–100.0)
Monocytes Absolute: 0.4 10*3/uL (ref 0.1–1.0)
Monocytes Relative: 3.7 % (ref 3.0–12.0)
Neutro Abs: 6.5 10*3/uL (ref 1.4–7.7)
Neutrophils Relative %: 66.8 % (ref 43.0–77.0)
Platelets: 290 10*3/uL (ref 150.0–400.0)
RBC: 4.63 Mil/uL (ref 3.87–5.11)
RDW: 12.6 % (ref 11.5–15.5)
WBC: 9.7 10*3/uL (ref 4.0–10.5)

## 2022-03-05 LAB — COMPREHENSIVE METABOLIC PANEL
ALT: 46 U/L — ABNORMAL HIGH (ref 0–35)
AST: 22 U/L (ref 0–37)
Albumin: 4.3 g/dL (ref 3.5–5.2)
Alkaline Phosphatase: 69 U/L (ref 39–117)
BUN: 13 mg/dL (ref 6–23)
CO2: 24 mEq/L (ref 19–32)
Calcium: 10.4 mg/dL (ref 8.4–10.5)
Chloride: 103 mEq/L (ref 96–112)
Creatinine, Ser: 0.8 mg/dL (ref 0.40–1.20)
GFR: 95.68 mL/min (ref >60.00)
Glucose, Bld: 99 mg/dL (ref 70–99)
Potassium: 3.6 mEq/L (ref 3.5–5.1)
Sodium: 135 mEq/L (ref 135–145)
Total Bilirubin: 0.4 mg/dL (ref 0.2–1.2)
Total Protein: 6.9 g/dL (ref 6.0–8.3)

## 2022-03-05 LAB — LIPASE: Lipase: 17 U/L (ref 11.0–59.0)

## 2022-03-05 MED ORDER — IOHEXOL 300 MG/ML  SOLN
125.0000 mL | Freq: Once | INTRAMUSCULAR | Status: AC | PRN
  Administered 2022-03-05: 125 mL via INTRAVENOUS

## 2022-03-05 NOTE — Addendum Note (Signed)
Addended by: Vance Peper A on: 03/05/2022 12:02 PM   Modules accepted: Orders

## 2022-03-05 NOTE — Addendum Note (Signed)
Addended by: Vance Peper A on: 03/05/2022 06:01 PM   Modules accepted: Orders

## 2022-03-06 ENCOUNTER — Encounter: Payer: Self-pay | Admitting: Nurse Practitioner

## 2022-03-15 ENCOUNTER — Encounter: Payer: Self-pay | Admitting: Physician Assistant

## 2022-03-27 ENCOUNTER — Other Ambulatory Visit: Payer: Self-pay | Admitting: Family Medicine

## 2022-03-27 DIAGNOSIS — F3160 Bipolar disorder, current episode mixed, unspecified: Secondary | ICD-10-CM

## 2022-04-08 ENCOUNTER — Encounter: Payer: BC Managed Care – PPO | Admitting: Dietician

## 2022-05-01 ENCOUNTER — Ambulatory Visit: Payer: BC Managed Care – PPO | Admitting: Physician Assistant

## 2022-05-30 ENCOUNTER — Telehealth: Payer: Self-pay | Admitting: Family Medicine

## 2022-05-30 ENCOUNTER — Other Ambulatory Visit: Payer: Self-pay | Admitting: Nurse Practitioner

## 2022-05-30 NOTE — Telephone Encounter (Signed)
Patient notified VIA phone that we didn't call and not sure who did. Dm/cma

## 2022-05-30 NOTE — Telephone Encounter (Signed)
Pt called and stated that it was a phone call from our office and someone left a message but her phone is messing up receiving messages . Please give the pt a call

## 2022-05-30 NOTE — Telephone Encounter (Signed)
I didn't call this pt

## 2022-05-30 NOTE — Telephone Encounter (Signed)
2 week supply of protonix sent to pharmacy.  Pt last seen 10/16 and initially prescribed medication and was advised to follow up in 1 week.  Follow up was not made. Pt was also referred to GI and do not see that has completed either. Left detailed message on pt's voicemail to return my call to schedule f/u of initial symptoms and continued medication. Advised pt I could only send in a 2 week supply until she can get in to be seen.

## 2022-05-30 NOTE — Telephone Encounter (Signed)
Chart supports Rx Last OV:  Next OV:

## 2022-06-06 ENCOUNTER — Encounter: Payer: Self-pay | Admitting: Family Medicine

## 2022-06-06 ENCOUNTER — Ambulatory Visit: Payer: BC Managed Care – PPO | Admitting: Family Medicine

## 2022-06-06 VITALS — BP 126/82 | HR 107 | Temp 98.1°F | Wt 258.0 lb

## 2022-06-06 DIAGNOSIS — J101 Influenza due to other identified influenza virus with other respiratory manifestations: Secondary | ICD-10-CM | POA: Diagnosis not present

## 2022-06-06 LAB — POCT INFLUENZA A/B
Influenza A, POC: NEGATIVE
Influenza B, POC: POSITIVE — AB

## 2022-06-06 LAB — POC COVID19 BINAXNOW: SARS Coronavirus 2 Ag: NEGATIVE

## 2022-06-06 MED ORDER — BENZONATATE 200 MG PO CAPS: 200.0000 mg | ORAL_CAPSULE | Freq: Two times a day (BID) | ORAL | 0 refills | Status: DC | PRN

## 2022-06-06 MED ORDER — PROMETHAZINE-DM 6.25-15 MG/5ML PO SYRP: 5.0000 mL | ORAL_SOLUTION | Freq: Four times a day (QID) | ORAL | 0 refills | Status: DC | PRN

## 2022-06-06 MED ORDER — NAPROXEN 500 MG PO TABS: 500.0000 mg | ORAL_TABLET | Freq: Two times a day (BID) | ORAL | 0 refills | Status: AC

## 2022-06-06 NOTE — Progress Notes (Signed)
Assessment/Plan:   Problem List Items Addressed This Visit   None Visit Diagnoses     Influenza B    -  Primary   Relevant Medications   naproxen (NAPROSYN) 500 MG tablet   benzonatate (TESSALON) 200 MG capsule   promethazine-dextromethorphan (PROMETHAZINE-DM) 6.25-15 MG/5ML syrup   Other Relevant Orders   POCT Influenza A/B (Completed)   POC COVID-19 (Completed)      Patient has confirmed Influenza B infection; primary symptoms include body aches and sore throat.  Since the patient is outside the treatment window for Tamiflu, supportive care including increased fluid intake, rest, and continued use of antipyretics and analgesics for symptomatic relief is recommended. The option of including a regimen of Naproxen for longer-lasting symptom management was discussed and can be prescribed if the patient favors its dosing schedule and effect.  There are no discontinued medications.    Subjective:  HPI: Encounter date: 06/06/2022  Alexis Bautista is a 37 y.o. female who has Viral syndrome; Healthcare maintenance; Other fatigue; Depressive disorder; Uses oral contraception; Posttraumatic stress disorder; Insomnia; Laryngitis, acute; Dysfunction of both eustachian tubes; Viral upper respiratory tract infection; B12 deficiency; Bipolar affective, mixed ([place]); Elevated ALT measurement; and Left upper quadrant abdominal pain on their problem list..   She  has no past medical history on file..   She presents with chief complaint of Sore Throat (Sore throat , cough and body aches x Monday.  ) .   CHIEF COMPLAINT: Patient presents with c laryngitis, bodily aches, sore throat, and cough.  HISTORY OF PRESENT ILLNESS:  Problem 1: The patient reports symptoms consistent with Influenza B, including difficulty in talking, bodily aches, and a sore throat that began approximately four days ago. The patient is currently outside of the treatment window for antiviral medications such as  Tamiflu.  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 No notable improvements or worsening at this time.  Problem 3: The patient has been self-medicating with over-the-counter medications including Tylenol, Ibuprofen, and a [facility] version of Dayquil. The patient reports that these medications are providing some symptomatic relief.  Problem 4: Gastrointestinal concerns were raised by the patient near the end of the encounter, though specifics were not detailed in the transcript provided.  REVIEW OF SYSTEMS: The patient denies ear congestion and nasal congestion. No report of chest pain or shortness of breath. All other systems reviews are negative based on patient reporting.   No past surgical history on file.  Outpatient Medications Prior to Visit  Medication Sig Dispense Refill   BLISOVI 24 FE 1-20 MG-MCG(24) tablet Take 1 tablet by mouth daily.     lithium carbonate (LITHOBID) 300 MG CR tablet TAKE 2 TABLETS BY MOUTH TWICE A DAY 360 tablet 0   ranitidine (ZANTAC) 75 MG tablet Take 75 mg by mouth 2 (two) times daily.     sertraline (ZOLOFT) 50 MG tablet Take 1.5 tablets (75 mg total) by mouth daily. 135 tablet 2   traZODone (DESYREL) 50 MG tablet Take 1-2 tablets (50-100 mg total) by mouth at bedtime. 180 tablet 1   triamcinolone cream (KENALOG) 0.1 % Apply topically 2 (two) times daily as needed.     pantoprazole (PROTONIX) 40 MG tablet TAKE 1 TABLET BY MOUTH EVERY DAY (Patient not taking: Reported on 06/06/2022) 14 tablet 0   No facility-administered medications prior to visit.    Family History  Problem Relation Age of Onset   Cancer Mother  Hypertension Father     Social History   Socioeconomic History   Marital status: Single    Spouse name: Not on file   Number of children: Not on file   Years of education: Not on file   Highest education level: Not on file  Occupational History   Not on file   Tobacco Use   Smoking status: Never   Smokeless tobacco: Never  Vaping Use   Vaping Use: Never used  Substance and Sexual Activity   Alcohol use: Yes   Drug use: Never   Sexual activity: Not on file  Other Topics Concern   Not on file  Social History Narrative   Not on file   Social Determinants of Health   Financial Resource Strain: Not on file  Food Insecurity: Not on file  Transportation Needs: Not on file  Physical Activity: Not on file  Stress: Not on file  Social Connections: Not on file  Intimate Partner Violence: Not on file                                                                                                 Objective:  Physical Exam: BP 126/82 (BP Location: Right Arm, Patient Position: Sitting, Cuff Size: Large)   Pulse (!) 107   Temp 98.1 F (36.7 C) (Oral)   Wt 258 lb (117 kg)   LMP  (LMP Unknown)   SpO2 97%   BMI 35.98 kg/m    Gen: NAD, resting comfortably HEENT: normal TM, tonsils are noted to be red and somewhat enlarged. CV: RRR with no murmurs appreciated Pulm: NWOB, CTAB with no crackles, wheezes, or rhonchi GI: Normal bowel sounds present. Soft, Nontender, Nondistended. MSK: no edema, cyanosis, or clubbing noted Skin: warm, dry Neuro: grossly normal, moves all extremities Psych: Normal affect and thought content   Results for orders placed or performed in visit on 06/06/22  POCT Influenza A/B  Result Value Ref Range   Influenza A, POC Negative Negative   Influenza B, POC Positive (A) Negative  POC COVID-19  Result Value Ref Range   SARS Coronavirus 2 Ag Negative Negative        Alesia Banda, MD, MS

## 2022-06-09 ENCOUNTER — Encounter: Payer: Self-pay | Admitting: Family Medicine

## 2022-06-09 DIAGNOSIS — J101 Influenza due to other identified influenza virus with other respiratory manifestations: Secondary | ICD-10-CM

## 2022-06-10 NOTE — Telephone Encounter (Signed)
Patient is aware of annotation below and verbalized understanding. Advised patient that we could send letter via mychart or she can pick up copy. Patient opted to pick up copy. Placed copy of letter up front for pick up.

## 2022-06-18 MED ORDER — PROMETHAZINE-DM 6.25-15 MG/5ML PO SYRP: 5.0000 mL | ORAL_SOLUTION | Freq: Four times a day (QID) | ORAL | 0 refills | Status: DC | PRN

## 2022-06-23 ENCOUNTER — Other Ambulatory Visit: Payer: Self-pay | Admitting: Family Medicine

## 2022-06-23 DIAGNOSIS — F3160 Bipolar disorder, current episode mixed, unspecified: Secondary | ICD-10-CM

## 2022-12-30 ENCOUNTER — Other Ambulatory Visit: Payer: Self-pay | Admitting: Family Medicine

## 2022-12-30 DIAGNOSIS — F3160 Bipolar disorder, current episode mixed, unspecified: Secondary | ICD-10-CM

## 2023-01-04 ENCOUNTER — Other Ambulatory Visit: Payer: Self-pay | Admitting: Family Medicine

## 2023-01-04 DIAGNOSIS — F419 Anxiety disorder, unspecified: Secondary | ICD-10-CM

## 2023-04-04 ENCOUNTER — Other Ambulatory Visit: Payer: Self-pay | Admitting: Family Medicine

## 2023-04-04 DIAGNOSIS — F419 Anxiety disorder, unspecified: Secondary | ICD-10-CM

## 2023-08-19 ENCOUNTER — Other Ambulatory Visit: Payer: Self-pay | Admitting: Family Medicine

## 2023-08-19 DIAGNOSIS — F3160 Bipolar disorder, current episode mixed, unspecified: Secondary | ICD-10-CM

## 2023-08-22 ENCOUNTER — Other Ambulatory Visit: Payer: Self-pay | Admitting: Family Medicine

## 2023-08-22 DIAGNOSIS — F3160 Bipolar disorder, current episode mixed, unspecified: Secondary | ICD-10-CM

## 2023-09-11 ENCOUNTER — Ambulatory Visit: Admitting: Family Medicine

## 2023-09-11 ENCOUNTER — Encounter: Payer: Self-pay | Admitting: Family Medicine

## 2023-09-11 VITALS — BP 124/82 | HR 83 | Temp 98.6°F | Ht 71.0 in | Wt 248.6 lb

## 2023-09-11 DIAGNOSIS — J020 Streptococcal pharyngitis: Secondary | ICD-10-CM

## 2023-09-11 DIAGNOSIS — J029 Acute pharyngitis, unspecified: Secondary | ICD-10-CM | POA: Diagnosis not present

## 2023-09-11 DIAGNOSIS — Z1152 Encounter for screening for COVID-19: Secondary | ICD-10-CM | POA: Diagnosis not present

## 2023-09-11 DIAGNOSIS — R051 Acute cough: Secondary | ICD-10-CM | POA: Diagnosis not present

## 2023-09-11 LAB — POCT RAPID STREP A (OFFICE): Rapid Strep A Screen: POSITIVE — AB

## 2023-09-11 LAB — POC COVID19 BINAXNOW: SARS Coronavirus 2 Ag: NEGATIVE

## 2023-09-11 MED ORDER — AZITHROMYCIN 250 MG PO TABS
ORAL_TABLET | ORAL | 0 refills | Status: AC
Start: 2023-09-11 — End: 2023-09-16

## 2023-09-11 MED ORDER — BENZONATATE 200 MG PO CAPS: 200.0000 mg | ORAL_CAPSULE | Freq: Two times a day (BID) | ORAL | 0 refills | Status: AC | PRN

## 2023-09-11 NOTE — Progress Notes (Signed)
 Established Patient Office Visit   Subjective:  Patient ID: Alexis Bautista, female    DOB: 04-08-1986  Age: 38 y.o. MRN: [id number]  Chief Complaint  Patient presents with   Sore Throat    Pt started to have irritation in throat 3 days ago. Started having runny nose, chills, jaw pain, and dry cough. No wheezing. No NV, fever. Pt coworker has Pneumonia.     Sore Throat  Associated symptoms include coughing. Pertinent negatives include no abdominal pain, headaches, shortness of breath or vomiting.   Encounter Diagnoses  Name Primary?   Sore throat Yes   Encounter for screening for COVID-19    Strep throat    Acute cough    3-day history of sore throat without headache fevers chills or nausea.  He has been experiencing a dry nonproductive cough without wheezing or difficulty breathing.  Seeing psychiatry for bipolar disease.  She is on oral contraceptives.   Review of Systems  Constitutional: Negative.  Negative for chills and fever.  HENT:  Positive for sore throat.   Eyes:  Negative for blurred vision, discharge and redness.  Respiratory:  Positive for cough. Negative for sputum production, shortness of breath and wheezing.   Cardiovascular: Negative.   Gastrointestinal:  Negative for abdominal pain, nausea and vomiting.  Genitourinary: Negative.   Musculoskeletal: Negative.  Negative for myalgias.  Skin:  Negative for rash.  Neurological:  Negative for tingling, loss of consciousness, weakness and headaches.  Endo/Heme/Allergies:  Negative for polydipsia.     Current Outpatient Medications:    azithromycin  (ZITHROMAX ) 250 MG tablet, Take 2 tablets on day 1, then 1 tablet daily on days 2 through 5, Disp: 6 tablet, Rfl: 0   benzonatate  (TESSALON ) 200 MG capsule, Take 1 capsule (200 mg total) by mouth 2 (two) times daily as needed for cough., Disp: 20 capsule, Rfl: 0   lithium  carbonate (LITHOBID ) 300 MG ER tablet, TAKE 2 TABLETS BY MOUTH TWICE A DAY, Disp: 360 tablet, Rfl: 1    ranitidine (ZANTAC) 75 MG tablet, Take 75 mg by mouth 2 (two) times daily., Disp: , Rfl:    sertraline  (ZOLOFT ) 50 MG tablet, TAKE 1 AND 1/2 TABLETS BY MOUTH DAILY (Patient taking differently: Take 25 mg by mouth daily.), Disp: 135 tablet, Rfl: 0   traZODone  (DESYREL ) 50 MG tablet, TAKE 1-2 TABLETS BY MOUTH AT BEDTIME., Disp: 180 tablet, Rfl: 0   triamcinolone cream (KENALOG) 0.1 %, Apply topically 2 (two) times daily as needed., Disp: , Rfl:    BLISOVI 24 FE 1-20 MG-MCG(24) tablet, Take 1 tablet by mouth daily., Disp: , Rfl:    Objective:     BP 124/82 (Cuff Size: Large)   Pulse 83   Temp 98.6 F (37 C) (Oral)   Ht 5\' 11"  (1.803 m)   Wt 248 lb 9.6 oz (112.8 kg)   SpO2 98%   BMI 34.67 kg/m    Physical Exam Constitutional:      General: She is not in acute distress.    Appearance: Normal appearance. She is not ill-appearing, toxic-appearing or diaphoretic.  HENT:     Head: Normocephalic and atraumatic.     Right Ear: Tympanic membrane, ear canal and external ear normal. No middle ear effusion. Tympanic membrane is not injected, erythematous or retracted.     Left Ear: External ear normal.  No middle ear effusion. Tympanic membrane is retracted. Tympanic membrane is not injected or erythematous.     Mouth/Throat:     Mouth: Mucous  membranes are moist.     Pharynx: Oropharynx is clear. No oropharyngeal exudate or posterior oropharyngeal erythema.  Eyes:     General: No scleral icterus.       Right eye: No discharge.        Left eye: No discharge.     Extraocular Movements: Extraocular movements intact.     Conjunctiva/sclera: Conjunctivae normal.     Pupils: Pupils are equal, round, and reactive to light.  Neck:     Thyroid : No thyromegaly.  Cardiovascular:     Rate and Rhythm: Normal rate and regular rhythm.  Pulmonary:     Effort: Pulmonary effort is normal. No respiratory distress.     Breath sounds: Normal breath sounds. No wheezing, rhonchi or rales.  Abdominal:      General: Bowel sounds are normal.  Musculoskeletal:     Cervical back: No rigidity or tenderness.  Lymphadenopathy:     Cervical: No cervical adenopathy.  Skin:    General: Skin is warm and dry.  Neurological:     Mental Status: She is alert and oriented to person, place, and time.  Psychiatric:        Mood and Affect: Mood normal.        Behavior: Behavior normal.      Results for orders placed or performed in visit on 09/11/23  POCT rapid strep A  Result Value Ref Range   Rapid Strep A Screen Positive (A) Negative  POC COVID-19 BinaxNow  Result Value Ref Range   SARS Coronavirus 2 Ag Negative Negative      The ASCVD Risk score (Arnett DK, et al., 2019) failed to calculate for the following reasons:   The 2019 ASCVD risk score is only valid for ages 56 to 29    Assessment & Plan:   Sore throat -     POCT rapid strep A  Encounter for screening for COVID-19 -     POC COVID-19 BinaxNow  Strep throat -     Azithromycin ; Take 2 tablets on day 1, then 1 tablet daily on days 2 through 5  Dispense: 6 tablet; Refill: 0  Acute cough -     Benzonatate ; Take 1 capsule (200 mg total) by mouth 2 (two) times daily as needed for cough.  Dispense: 20 capsule; Refill: 0    Return if symptoms worsen or fail to improve.  Out of work through tomorrow.  Use new toothbrush after first day of azithromycin .  Tonna Frederic, MD

## 2023-09-17 ENCOUNTER — Ambulatory Visit: Payer: Self-pay | Admitting: Family Medicine

## 2023-09-17 NOTE — Telephone Encounter (Signed)
 Patient called in stating she is still experiencing a cough with nasal drainage that is green. Patient completed entire round of antibiotics and states the cough does not feel it is in her chest anymore, but she is concerned with the sinus pressure in face. Patient encouraged to try mucinex  and steam/humidifer. Patient encouraged to call back by Friday if her symptoms do not improve at all or if she runs a fever.

## 2023-09-17 NOTE — Telephone Encounter (Signed)
 This RN made first attempt to contact patient with no answer. A voicemail was left with call back number provided.   Copied from CRM [id number]. Topic: Clinical - Medical Advice >> Sep 17, 2023  3:22 PM Jenice Mitts wrote: Reason for CRM: Patient is calling because she would like advice on what's next she does not feel like she is getting any better and would like advice on what to do next

## 2023-09-17 NOTE — Telephone Encounter (Signed)
 Reason for Disposition . Cough with cold symptoms (e.g., runny nose, postnasal drip, throat clearing)  Answer Assessment - Initial Assessment Questions 1. ONSET: "When did the cough begin?"      4/24 - saw doctor 2. SEVERITY: "How bad is the cough today?"      Pain in throat with chest, but not as deep and frequent 3. SPUTUM: "Describe the color of your sputum" (none, dry cough; clear, white, yellow, green)     Green 4. HEMOPTYSIS: "Are you coughing up any blood?" If so ask: "How much?" (flecks, streaks, tablespoons, etc.)     no 5. DIFFICULTY BREATHING: "Are you having difficulty breathing?" If Yes, ask: "How bad is it?" (e.g., mild, moderate, severe)    - MILD: No SOB at rest, mild SOB with walking, speaks normally in sentences, can lie down, no retractions, pulse < 100.    - MODERATE: SOB at rest, SOB with minimal exertion and prefers to sit, cannot lie down flat, speaks in phrases, mild retractions, audible wheezing, pulse 100-120.    - SEVERE: Very SOB at rest, speaks in single words, struggling to breathe, sitting hunched forward, retractions, pulse > 120      No 6. FEVER: "Do you have a fever?" If Yes, ask: "What is your temperature, how was it measured, and when did it start?"     No 7. CARDIAC HISTORY: "Do you have any history of heart disease?" (e.g., heart attack, congestive heart failure)      No 8. LUNG HISTORY: "Do you have any history of lung disease?"  (e.g., pulmonary embolus, asthma, emphysema)     No 9. PE RISK FACTORS: "Do you have a history of blood clots?" (or: recent major surgery, recent prolonged travel, bedridden)     No 10. OTHER SYMPTOMS: "Do you have any other symptoms?" (e.g., runny nose, wheezing, chest pain)       Sinus pain and pressure, runny nose with green discharge  Protocols used: Cough - Acute Productive-A-AH
